# Patient Record
Sex: Male | Born: 1935 | Race: White | Hispanic: No | Smoking: Former smoker
Health system: Southern US, Community
[De-identification: ages and names within clinical notes are randomized; demographics above are authoritative.]

## PROBLEM LIST (undated history)

## (undated) DIAGNOSIS — N419 Inflammatory disease of prostate, unspecified: Secondary | ICD-10-CM

## (undated) DIAGNOSIS — E119 Type 2 diabetes mellitus without complications: Secondary | ICD-10-CM

## (undated) DIAGNOSIS — E785 Hyperlipidemia, unspecified: Secondary | ICD-10-CM

## (undated) DIAGNOSIS — F0390 Unspecified dementia without behavioral disturbance: Secondary | ICD-10-CM

## (undated) DIAGNOSIS — M1990 Unspecified osteoarthritis, unspecified site: Secondary | ICD-10-CM

## (undated) DIAGNOSIS — H269 Unspecified cataract: Secondary | ICD-10-CM

## (undated) DIAGNOSIS — Z87442 Personal history of urinary calculi: Secondary | ICD-10-CM

## (undated) DIAGNOSIS — H9190 Unspecified hearing loss, unspecified ear: Secondary | ICD-10-CM

## (undated) DIAGNOSIS — C801 Malignant (primary) neoplasm, unspecified: Secondary | ICD-10-CM

## (undated) DIAGNOSIS — R339 Retention of urine, unspecified: Secondary | ICD-10-CM

## (undated) DIAGNOSIS — N400 Enlarged prostate without lower urinary tract symptoms: Secondary | ICD-10-CM

## (undated) DIAGNOSIS — F419 Anxiety disorder, unspecified: Secondary | ICD-10-CM

## (undated) DIAGNOSIS — I10 Essential (primary) hypertension: Secondary | ICD-10-CM

## (undated) DIAGNOSIS — N390 Urinary tract infection, site not specified: Secondary | ICD-10-CM

## (undated) DIAGNOSIS — R972 Elevated prostate specific antigen [PSA]: Secondary | ICD-10-CM

## (undated) HISTORY — DX: Essential (primary) hypertension: I10

## (undated) HISTORY — PX: CATARACT EXTRACTION: SUR2

## (undated) HISTORY — DX: Personal history of urinary calculi: Z87.442

## (undated) HISTORY — DX: Unspecified hearing loss, unspecified ear: H91.90

## (undated) HISTORY — DX: Elevated prostate specific antigen (PSA): R97.20

## (undated) HISTORY — DX: Urinary tract infection, site not specified: N39.0

## (undated) HISTORY — PX: HERNIA REPAIR: SHX51

## (undated) HISTORY — DX: Retention of urine, unspecified: R33.9

## (undated) HISTORY — DX: Unspecified osteoarthritis, unspecified site: M19.90

## (undated) HISTORY — PX: BACK SURGERY: SHX140

## (undated) HISTORY — DX: Inflammatory disease of prostate, unspecified: N41.9

## (undated) HISTORY — DX: Type 2 diabetes mellitus without complications: E11.9

---

## 2012-12-03 ENCOUNTER — Emergency Department: Payer: Self-pay | Admitting: Internal Medicine

## 2012-12-03 LAB — URINALYSIS, COMPLETE
Ketone: NEGATIVE
Nitrite: NEGATIVE
Ph: 5 (ref 4.5–8.0)
Protein: NEGATIVE
RBC,UR: 1 /HPF (ref 0–5)
Specific Gravity: 1.026 (ref 1.003–1.030)
WBC UR: 3 /HPF (ref 0–5)

## 2013-01-09 ENCOUNTER — Ambulatory Visit (INDEPENDENT_AMBULATORY_CARE_PROVIDER_SITE_OTHER): Payer: Medicare HMO | Admitting: Podiatry

## 2013-01-09 ENCOUNTER — Encounter: Payer: Self-pay | Admitting: Podiatry

## 2013-01-09 ENCOUNTER — Ambulatory Visit (INDEPENDENT_AMBULATORY_CARE_PROVIDER_SITE_OTHER): Payer: Medicare HMO

## 2013-01-09 VITALS — BP 122/84 | HR 68 | Resp 16 | Ht 75.0 in | Wt 213.0 lb

## 2013-01-09 DIAGNOSIS — R52 Pain, unspecified: Secondary | ICD-10-CM

## 2013-01-09 DIAGNOSIS — L97509 Non-pressure chronic ulcer of other part of unspecified foot with unspecified severity: Secondary | ICD-10-CM

## 2013-01-09 DIAGNOSIS — M2040 Other hammer toe(s) (acquired), unspecified foot: Secondary | ICD-10-CM

## 2013-01-09 NOTE — Progress Notes (Signed)
Subjective:     Patient ID: Albert Fritz, male   DOB: [birth year], 79 y.o.   MRN: [id number]  HPI patient states she was concerned about his second toe left and he needs diabetic shoes due to his advanced neurological and vascular disease along with diabetes   Review of Systems  All other systems reviewed and are negative.       Objective:   Physical Exam  Skin: Skin is dry.   neurovascular status diminished both feet with digital deformity and distal keratotic lesion second digit left that is painful with no current breakdown of skin     Assessment:     Mild distal keratotic lesion secondary to foot structure and add wrist diabetic environment    Plan:     Debrided lesion with no drainage and applied dressing. Advised on diabetic shoes which we will get permission from family physician for

## 2013-01-09 NOTE — Patient Instructions (Signed)
Continue soaks and call us if it gets red or stays painful

## 2013-01-19 ENCOUNTER — Telehealth: Payer: Self-pay | Admitting: Podiatry

## 2013-01-19 NOTE — Telephone Encounter (Signed)
Left a message with his friend for him to schedule an appointment to be measured for diabetic shoes

## 2013-01-30 ENCOUNTER — Ambulatory Visit (INDEPENDENT_AMBULATORY_CARE_PROVIDER_SITE_OTHER): Payer: Medicare HMO | Admitting: Podiatry

## 2013-01-30 DIAGNOSIS — B351 Tinea unguium: Secondary | ICD-10-CM

## 2013-01-30 DIAGNOSIS — M2040 Other hammer toe(s) (acquired), unspecified foot: Secondary | ICD-10-CM

## 2013-01-31 NOTE — Progress Notes (Signed)
Subjective:     Patient ID: Albert Fritz, male   DOB: 06/19/1928, 79 y.o.   MRN: [id number]  HPI here to be measured for diabetic shoes. Patient is at risk   Review of Systems     Objective:   Physical Exam Diminished neurovascular status with callus corn formation pre-alterative in nature and hammertoe    Assessment:     At wrist diabetic with chances for future problems with this the    Plan:     Measured for diabetic shoes at the current time

## 2013-03-02 ENCOUNTER — Ambulatory Visit (INDEPENDENT_AMBULATORY_CARE_PROVIDER_SITE_OTHER): Payer: Medicare HMO | Admitting: Podiatry

## 2013-03-02 ENCOUNTER — Encounter: Payer: Self-pay | Admitting: Podiatry

## 2013-03-02 VITALS — BP 104/64 | HR 95 | Resp 16 | Ht 75.0 in | Wt 200.0 lb

## 2013-03-02 DIAGNOSIS — E1159 Type 2 diabetes mellitus with other circulatory complications: Secondary | ICD-10-CM

## 2013-03-02 DIAGNOSIS — M2040 Other hammer toe(s) (acquired), unspecified foot: Secondary | ICD-10-CM

## 2013-03-02 DIAGNOSIS — L84 Corns and callosities: Secondary | ICD-10-CM

## 2013-03-02 NOTE — Progress Notes (Signed)
Subjective:     Patient ID: Albert Fritz, male   DOB: 10-08-1928, 79 y.o.   MRN: [id number]  HPI patient presents to pick up diabetic shoes with previous radial type lesions and significant circulatory and neurological issues long-term with diabetes   Review of Systems     Objective:   Physical Exam Neurovascular status unchanged with structural alignment noted   to be unhealthy Assessment:     At risk diabetic with chronic foot structural issues and neurological vascular disease    Plan:     Dispensed diabetic shoes with all instructions at the current time. Reappoint for routine care

## 2013-09-07 ENCOUNTER — Emergency Department: Payer: Self-pay | Admitting: Emergency Medicine

## 2013-09-07 LAB — URINALYSIS, COMPLETE
Bacteria: NEGATIVE
Bilirubin,UR: NEGATIVE
Blood: NEGATIVE
Glucose,UR: NEGATIVE mg/dL (ref 0–75)
Ketone: NEGATIVE
LEUKOCYTE ESTERASE: NEGATIVE
PROTEIN: NEGATIVE
Ph: 6 (ref 4.5–8.0)
RBC,UR: NONE SEEN /HPF (ref 0–5)
SPECIFIC GRAVITY: 1.009 (ref 1.003–1.030)

## 2013-11-02 ENCOUNTER — Emergency Department: Payer: Self-pay | Admitting: Internal Medicine

## 2013-11-02 LAB — URINALYSIS, COMPLETE
Bilirubin,UR: NEGATIVE
Glucose,UR: NEGATIVE mg/dL (ref 0–75)
Ketone: NEGATIVE
Nitrite: NEGATIVE
Ph: 7 (ref 4.5–8.0)
Protein: NEGATIVE
RBC,UR: 13 /HPF (ref 0–5)
SQUAMOUS EPITHELIAL: NONE SEEN
Specific Gravity: 1.008 (ref 1.003–1.030)

## 2013-11-09 ENCOUNTER — Emergency Department: Payer: Self-pay | Admitting: Emergency Medicine

## 2013-12-08 ENCOUNTER — Emergency Department: Payer: Self-pay | Admitting: Emergency Medicine

## 2014-01-09 ENCOUNTER — Emergency Department: Payer: Self-pay | Admitting: Emergency Medicine

## 2014-07-07 ENCOUNTER — Emergency Department: Payer: Medicare HMO

## 2014-07-07 ENCOUNTER — Encounter: Payer: Self-pay | Admitting: Emergency Medicine

## 2014-07-07 ENCOUNTER — Emergency Department
Admission: EM | Admit: 2014-07-07 | Discharge: 2014-07-07 | Disposition: A | Discharge status: Home / Self Care | Payer: Medicare HMO | Attending: Emergency Medicine | Admitting: Emergency Medicine

## 2014-07-07 DIAGNOSIS — I10 Essential (primary) hypertension: Secondary | ICD-10-CM | POA: Insufficient documentation

## 2014-07-07 DIAGNOSIS — Z87891 Personal history of nicotine dependence: Secondary | ICD-10-CM | POA: Insufficient documentation

## 2014-07-07 DIAGNOSIS — R1030 Lower abdominal pain, unspecified: Secondary | ICD-10-CM | POA: Insufficient documentation

## 2014-07-07 DIAGNOSIS — Z79899 Other long term (current) drug therapy: Secondary | ICD-10-CM | POA: Insufficient documentation

## 2014-07-07 DIAGNOSIS — T83511A Infection and inflammatory reaction due to indwelling urethral catheter, initial encounter: Secondary | ICD-10-CM

## 2014-07-07 DIAGNOSIS — I4892 Unspecified atrial flutter: Secondary | ICD-10-CM | POA: Insufficient documentation

## 2014-07-07 DIAGNOSIS — E119 Type 2 diabetes mellitus without complications: Secondary | ICD-10-CM | POA: Insufficient documentation

## 2014-07-07 DIAGNOSIS — N390 Urinary tract infection, site not specified: Secondary | ICD-10-CM | POA: Diagnosis not present

## 2014-07-07 DIAGNOSIS — T8351XA Infection and inflammatory reaction due to indwelling urinary catheter, initial encounter: Secondary | ICD-10-CM | POA: Insufficient documentation

## 2014-07-07 DIAGNOSIS — Y846 Urinary catheterization as the cause of abnormal reaction of the patient, or of later complication, without mention of misadventure at the time of the procedure: Secondary | ICD-10-CM | POA: Diagnosis not present

## 2014-07-07 DIAGNOSIS — R531 Weakness: Secondary | ICD-10-CM | POA: Diagnosis present

## 2014-07-07 LAB — URINALYSIS COMPLETE WITH MICROSCOPIC (ARMC ONLY)
BILIRUBIN URINE: NEGATIVE
GLUCOSE, UA: NEGATIVE mg/dL
KETONES UR: NEGATIVE mg/dL
Nitrite: NEGATIVE
Protein, ur: NEGATIVE mg/dL
SQUAMOUS EPITHELIAL / LPF: NONE SEEN
Specific Gravity, Urine: 1.008 (ref 1.005–1.030)
pH: 6 (ref 5.0–8.0)

## 2014-07-07 LAB — CBC WITH DIFFERENTIAL/PLATELET
Basophils Absolute: 0 10*3/uL (ref 0–0.1)
Basophils Relative: 0 %
Eosinophils Absolute: 0.1 10*3/uL (ref 0–0.7)
Eosinophils Relative: 1 %
HCT: 44.9 % (ref 40.0–52.0)
Hemoglobin: 14.5 g/dL (ref 13.0–18.0)
LYMPHS ABS: 2.8 10*3/uL (ref 1.0–3.6)
Lymphocytes Relative: 31 %
MCH: 29.5 pg (ref 26.0–34.0)
MCHC: 32.4 g/dL (ref 32.0–36.0)
MCV: 91 fL (ref 80.0–100.0)
Monocytes Absolute: 0.9 10*3/uL (ref 0.2–1.0)
Monocytes Relative: 10 %
NEUTROS ABS: 5.2 10*3/uL (ref 1.4–6.5)
Platelets: 197 10*3/uL (ref 150–440)
RBC: 4.93 MIL/uL (ref 4.40–5.90)
RDW: 13.8 % (ref 11.5–14.5)
WBC: 8.9 10*3/uL (ref 3.8–10.6)

## 2014-07-07 LAB — COMPREHENSIVE METABOLIC PANEL
ALBUMIN: 3.8 g/dL (ref 3.5–5.0)
ALK PHOS: 132 U/L — AB (ref 38–126)
ALT: 25 U/L (ref 17–63)
ANION GAP: 9 (ref 5–15)
AST: 33 U/L (ref 15–41)
BUN: 17 mg/dL (ref 6–20)
CHLORIDE: 100 mmol/L — AB (ref 101–111)
CO2: 30 mmol/L (ref 22–32)
Calcium: 9.2 mg/dL (ref 8.9–10.3)
Creatinine, Ser: 0.98 mg/dL (ref 0.61–1.24)
GFR calc Af Amer: >60 mL/min (ref >60)
GFR calc non Af Amer: >60 mL/min (ref >60)
Glucose, Bld: 125 mg/dL — ABNORMAL HIGH (ref 65–99)
Potassium: 5.2 mmol/L — ABNORMAL HIGH (ref 3.5–5.1)
Sodium: 139 mmol/L (ref 135–145)
Total Bilirubin: 0.7 mg/dL (ref 0.3–1.2)
Total Protein: 7.2 g/dL (ref 6.5–8.1)

## 2014-07-07 LAB — TROPONIN I: Troponin I: <0.03 ng/mL (ref <0.031)

## 2014-07-07 LAB — LIPASE, BLOOD: Lipase: 28 U/L (ref 22–51)

## 2014-07-07 MED ORDER — MORPHINE SULFATE 4 MG/ML IJ SOLN
4.0000 mg | Freq: Once | INTRAMUSCULAR | Status: AC
  Administered 2014-07-07: 4 mg via INTRAVENOUS

## 2014-07-07 MED ORDER — CIPROFLOXACIN HCL 500 MG PO TABS: 500.0000 mg | ORAL_TABLET | Freq: Two times a day (BID) | ORAL | Status: AC

## 2014-07-07 MED ORDER — IOHEXOL 300 MG/ML  SOLN
100.0000 mL | Freq: Once | INTRAMUSCULAR | Status: AC | PRN
  Administered 2014-07-07: 100 mL via INTRAVENOUS

## 2014-07-07 MED ORDER — ONDANSETRON HCL 4 MG PO TABS: 4.0000 mg | ORAL_TABLET | Freq: Every day | ORAL | Status: DC | PRN

## 2014-07-07 MED ORDER — SODIUM CHLORIDE 0.9 % IV SOLN
1000.0000 mL | Freq: Once | INTRAVENOUS | Status: AC
  Administered 2014-07-07: 1000 mL via INTRAVENOUS

## 2014-07-07 MED ORDER — ONDANSETRON HCL 4 MG/2ML IJ SOLN
INTRAMUSCULAR | Status: AC
  Administered 2014-07-07: 4 mg via INTRAVENOUS
  Filled 2014-07-07: qty 2

## 2014-07-07 MED ORDER — DEXTROSE 5 % IV SOLN
INTRAVENOUS | Status: AC
  Administered 2014-07-07: 1 g via INTRAVENOUS
  Filled 2014-07-07: qty 10

## 2014-07-07 MED ORDER — ONDANSETRON HCL 4 MG/2ML IJ SOLN
4.0000 mg | Freq: Once | INTRAMUSCULAR | Status: AC
  Administered 2014-07-07: 4 mg via INTRAVENOUS

## 2014-07-07 MED ORDER — MORPHINE SULFATE 4 MG/ML IJ SOLN
INTRAMUSCULAR | Status: AC
  Administered 2014-07-07: 4 mg via INTRAVENOUS
  Filled 2014-07-07: qty 1

## 2014-07-07 MED ORDER — DEXTROSE 5 % IV SOLN: 1.0000 g | Freq: Once | INTRAVENOUS | Status: AC

## 2014-07-07 NOTE — ED Notes (Signed)
Nurse called to room for severe lower abd pain. Jacqualine Code, MD also brought to room. Pt lower abd distended. New orders received. IN and out th done. Pt states relief after draining bladder. Pt with 800cc urine output

## 2014-07-07 NOTE — ED Notes (Signed)
Patient feels dizzy, nervousness and weakness. Also c/o abdominal pain. Lower abdominal pain

## 2014-07-07 NOTE — ED Notes (Addendum)
Nurse called to room . Pt back from CT scan. Pt states it is time for him to in and out cath. Pt with 751ml urine out put

## 2014-07-07 NOTE — ED Provider Notes (Signed)
[facility] Emergency Department Provider Note  ____________________________________________  Time seen: 8:25 AM  I have reviewed the triage vital signs and the nursing notes.   HISTORY  Chief Complaint Weakness and Abdominal Pain   Patient reports feeling generally weak for about one weeks time. Patient does report a feeling of aching in his lower abdomen across his bladder. He reports that he is eating slightly less, but has not had any fevers, chest pain, shortness of breath, or severe pain.  Patient does note that he has been self catheterizing for approximately one year due to prostate difficulties.  Rate his pain as mild, location is over the bladder, is associated with some burning with urination recently.  Pain is worse with urination.  In addition, when questioning patient about history of cardiac disease he states he has none. His ECG showed atrial flutter and he states he has no history of this. He denies any chest pain, no shortness of breath, no exertional dyspnea. Patient reports no palpitations. He does take a full strength aspirin daily.    HPI Albert Fritz is a 81 y.o. male here with lowrr abd pain and burning with urination.   Past Medical History  Diagnosis Date  . Diabetes mellitus without complication   . Hypertension     There are no active problems to display for this patient.   Past Surgical History  Procedure Laterality Date  . Back surgery    . Hernia repair      Current Outpatient Rx  Name  Route  Sig  Dispense  Refill  . atorvastatin (LIPITOR) 10 MG tablet   Oral   Take 10 mg by mouth daily.         Marland Kitchen doxazosin (CARDURA) 2 MG tablet   Oral   Take 2 mg by mouth daily.         Marland Kitchen glipiZIDE (GLUCOTROL) 5 MG tablet   Oral   Take 5 mg by mouth daily before breakfast.         . hydrochlorothiazide (MICROZIDE) 12.5 MG capsule   Oral   Take 12.5 mg by mouth daily.         . metFORMIN (GLUMETZA) 500 MG  (MOD) 24 hr tablet   Oral   Take 500 mg by mouth daily with breakfast.         . quinapril (ACCUPRIL) 20 MG tablet   Oral   Take 20 mg by mouth at bedtime.           Allergies Review of patient's allergies indicates no known allergies.  History reviewed. No pertinent family history.  Social History History  Substance Use Topics  . Smoking status: Former Smoker    Types: Cigarettes  . Smokeless tobacco: Never Used     Comment: quit   . Alcohol Use: No    Review of Systems  Constitutional: Negative for fever. Eyes: Negative for visual changes. ENT: Negative for sore throat. Cardiovascular: Negative for chest pain. Respiratory: Negative for shortness of breath. Gastrointestinal: Negative vomiting and diarrhea. Patient reports some burning with urination, an achy pain over the lower abdomen. Genitourinary: Negative for dysuria. Musculoskeletal: Negative for back pain. Skin: Negative for rash. Neurological: Negative for headaches, focal weakness or numbness.   10-point ROS otherwise negative.  ____________________________________________   PHYSICAL EXAM:  VITAL SIGNS: ED Triage Vitals  Enc Vitals Group     BP 07/07/14 0741 104/55 mmHg     Pulse Rate 07/07/14 0741 90  Resp 07/07/14 0741 18     Temp 07/07/14 0741 97.8 F (36.6 C)     Temp Source 07/07/14 0741 Oral     SpO2 07/07/14 0741 97 %     Weight 07/07/14 0741 195 lb (88.451 kg)     Height 07/07/14 0741 6\' 3"  (1.905 m)     Head Cir --      Peak Flow --      Pain Score 07/07/14 0815 2     Pain Loc --      Pain Edu? --      Excl. in [place]? --      Constitutional: Alert and oriented. Well appearing and in no distress. Eyes: Conjunctivae are normal. PERRL. Normal extraocular movements. ENT   Head: Normocephalic and atraumatic.   Nose: No congestion/rhinnorhea.   Mouth/Throat: Mucous membranes are moist.   Neck: No stridor. Hematological/Lymphatic/Immunilogical: No cervical  lymphadenopathy. Cardiovascular: Normal rate, irregular rhythm. Normal and symmetric distal pulses are present in all extremities. No murmurs, rubs, or gallops. Respiratory: Normal respiratory effort without tachypnea nor retractions. Breath sounds are clear and equal bilaterally. No wheezes/rales/rhonchi. Gastrointestinal: Soft and nontender except for mild tenderness in the suprapubic area. There is no right lower quadrant pain there is no rebound or guarding.. No distention. No abdominal bruits. There is no CVA tenderness. Genitourinary: Normal circumcised penis with no evidence of redness. Testicles are nontender descended bilaterally. There is no groin mass or hernia present. Musculoskeletal: Nontender with normal range of motion in all extremities. No joint effusions.   Right lower leg:  No tenderness or edema.   Left lower leg:  No tenderness or edema. Neurologic:  Normal speech and language. No gross focal neurologic deficits are appreciated. Speech is normal. No gait instability. Skin:  Skin is warm, dry and intact. No rash noted. Psychiatric: Mood and affect are normal. Speech and behavior are normal. Patient exhibits appropriate insight and judgment.  ____________________________________________   EKG  EKG reveals atrial flutter with variable block, ventricular rate 91, left axis deviation is noted, potential evidence for old inferior infarct is noted. There is no acute ST elevation noted. I do not see any acute ischemic changes present on today's 12-lead. Ventricular rate 91 QRS 84 QTc 477.  ____________________________________________    RADIOLOGY  IMPRESSION: 1. Irregular soft tissue mass within the base the bladder. Considerations include enlarged prostate or primary bladder malignancy. Consider cystoscopy for further evaluation. 2. Distended urinary bladder. 3. Prostatic enlargement. 4. Cardiomegaly. 5. Coronary artery disease. 6. No renal mass or ureteral  stones. 7. Normal appendix. 8. Small fat containing paraumbilical hernia.  ____________________________________________   PROCEDURES  Procedure(s) performed: None  Critical Care performed: No  ____________________________________________   INITIAL IMPRESSION / ASSESSMENT AND PLAN / ED COURSE  Pertinent labs & imaging results that were available during my care of the patient were reviewed by me and considered in my medical decision making (see chart for details).  .Time stamp  Overall patient appears well, his symptoms are most suggestive of a possible UTI. However, we'll obtain basic labs, urinalysis, troponin given new onset atrial flutter of indeterminant chronicity. Patient denies any cardiopulmonary symptoms at this time and I wonder if this is a more chronic process, however nothing in patient's previous chart supports a history of prior atrial flutter.  I plan to review labs, provide hydration the patient, and discussed with cardiology regarding his atrial flutter. At present I do not think there is indication for admission based on his atrial flutter  and discussion with patient and his family would like to defer to discussion with cardiology or with  primary regarding anticoagulation. We did discuss stroke risk being elevated with this, and patient understands this and importance of follow-up regarding this new atrial flutter.  11:10 PM, patient reports sudden worsening of lower abdominal pain which she states is severe and over the suprapubic region. Does appear that there is some swelling suprapubically, this could be related to urinary retention. We will attempt to catheterize patient. Given age and worsening of pain I have now ordered a CT scan.  Of note labs are delayed at present due to new medical record and lab related issues.    Will medicate patient with morphine and Zofran at this time.  Results for orders placed or performed during the hospital encounter of  07/07/14  Comprehensive metabolic panel  Result Value Ref Range   Sodium 139 135 - 145 mmol/L   Potassium 5.2 (H) 3.5 - 5.1 mmol/L   Chloride 100 (L) 101 - 111 mmol/L   CO2 30 22 - 32 mmol/L   Glucose, Bld 125 (H) 65 - 99 mg/dL   BUN 17 6 - 20 mg/dL   Creatinine, Ser 0.98 0.61 - 1.24 mg/dL   Calcium 9.2 8.9 - 10.3 mg/dL   Total Protein 7.2 6.5 - 8.1 g/dL   Albumin 3.8 3.5 - 5.0 g/dL   AST 33 15 - 41 U/L   ALT 25 17 - 63 U/L   Alkaline Phosphatase 132 (H) 38 - 126 U/L   Total Bilirubin 0.7 0.3 - 1.2 mg/dL   GFR calc non Af Amer >60 >60 mL/min   GFR calc Af Amer >60 >60 mL/min   Anion gap 9 5 - 15  Lipase, blood  Result Value Ref Range   Lipase 28 22 - 51 U/L  CBC WITH DIFFERENTIAL  Result Value Ref Range   WBC 8.9 3.8 - 10.6 K/uL   RBC 4.93 4.40 - 5.90 MIL/uL   Hemoglobin 14.5 13.0 - 18.0 g/dL   HCT 44.9 40.0 - 52.0 %   MCV 91.0 80.0 - 100.0 fL   MCH 29.5 26.0 - 34.0 pg   MCHC 32.4 32.0 - 36.0 g/dL   RDW 13.8 11.5 - 14.5 %   Platelets 197 150 - 440 K/uL   Neutrophils Relative % 58% %   Neutro Abs 5.2 1.4 - 6.5 K/uL   Lymphocytes Relative 31% %   Lymphs Abs 2.8 1.0 - 3.6 K/uL   Monocytes Relative 10% %   Monocytes Absolute 0.9 0.2 - 1.0 K/uL   Eosinophils Relative 1% %   Eosinophils Absolute 0.1 0 - 0.7 K/uL   Basophils Relative 0% %   Basophils Absolute 0.0 0 - 0.1 K/uL  Urinalysis complete, with microscopic [facility])  Result Value Ref Range   Color, Urine YELLOW (A) YELLOW   APPearance CLEAR (A) CLEAR   Glucose, UA NEGATIVE NEGATIVE mg/dL   Bilirubin Urine NEGATIVE NEGATIVE   Ketones, ur NEGATIVE NEGATIVE mg/dL   Specific Gravity, Urine 1.008 1.005 - 1.030   Hgb urine dipstick 1+ (A) NEGATIVE   pH 6.0 5.0 - 8.0   Protein, ur NEGATIVE NEGATIVE mg/dL   Nitrite NEGATIVE NEGATIVE   Leukocytes, UA 2+ (A) NEGATIVE   RBC / HPF 0-5 0 - 5 RBC/hpf   WBC, UA 6-30 0 - 5 WBC/hpf   Bacteria, UA RARE (A) NONE SEEN   Squamous Epithelial / LPF NONE SEEN NONE SEEN   Patient  labs and history are consistent with urinary tract infection. Treated with ceftriaxone here, and continued will continue patient on Cipro for the next week. Patient has a urologist and primary care physician whom he can follow up with, I discussed the CT findings with him and he is quite agreeable to plan of care. In addition I spoke to Dr. Josefa Half of cardiology regarding patient returns asymptomatic atrial flutter, patient will follow-up with him in the clinic next week.  Coverage and cautioned including worsening symptoms, weakness, inability urinate or self catheter, fever, palpitations, chest pain discussed with patient and he is quite amenable to plan of care and follow-up as an outpatient. ____________________________________________   FINAL CLINICAL IMPRESSION(S) / ED DIAGNOSES  Final diagnoses:  Atrial flutter, unspecified  Urinary tract infection associated with catheterization of urinary tract, initial encounter     Delman Kitten, MD 07/07/14 1627

## 2014-07-07 NOTE — Discharge Instructions (Signed)
Atrial Flutter Atrial flutter is a heart rhythm that can cause the heart to beat very fast (tachycardia). It originates in the upper chambers of the heart (atria). In atrial flutter, the top chambers of the heart (atria) often beat much faster than the bottom chambers of the heart (ventricles). Atrial flutter has a regular "saw toothed" appearance in an EKG readout. An EKG is a test that records the electrical activity of the heart. Atrial flutter can cause the heart to beat up to 150 beats per minute (BPM). Atrial flutter can either be short lived (paroxysmal) or permanent.  CAUSES  Causes of atrial flutter can be many. Some of these include:  Heart related issues:  Heart attack (myocardial infarction).  Heart failure.  Heart valve problems.  Poorly controlled high blood pressure (hypertension).  After open heart surgery.  Lung related issues:  A blood clot in the lungs (pulmonary embolism).  Chronic obstructive pulmonary disease (COPD). Medications used to treat COPD can attribute to atrial flutter.  Other related causes:  Hyperthyroidism.  Caffeine.  Some decongestant cold medications.  Low electrolyte levels such as potassium or magnesium.  Cocaine. SYMPTOMS  An awareness of your heart beating rapidly (palpitations).  Shortness of breath.  Chest pain.  Low blood pressure (hypotension).  Dizziness or fainting. DIAGNOSIS  Different tests can be performed to diagnose atrial flutter.   An EKG.  Holter monitor. This is a 24-hour recording of your heart rhythm. You will also be given a diary. Write down all symptoms that you have and what you were doing at the time you experienced symptoms.  Cardiac event monitor. This small device can be worn for up to 30 days. When you have heart symptoms, you will push a button on the device. This will then record your heart rhythm.  Echocardiogram. This is an imaging test to look at your heart. Your caregiver will look at your  heart valves and the ventricles.  Stress test. This test can help determine if the atrial flutter is related to exercise or if coronary artery disease is present.  Laboratory studies will look at certain blood levels like:  Complete blood count (CBC).  Potassium.  Magnesium.  Thyroid function. TREATMENT  Treatment of atrial flutter varies. A combination of therapies may be used or sometimes atrial flutter may need only 1 type of treatment.  Lab work: If your blood work, such as your electrolytes (potassium, magnesium) or your thyroid function tests, are abnormal, your caregiver will treat them accordingly.  Medication:  There are several different types of medications that can convert your heart to a normal rhythm and prevent atrial flutter from reoccurring.  Nonsurgical procedures: Nonsurgical techniques may be used to control atrial flutter. Some examples include:  Cardioversion. This technique uses either drugs or an electrical shock to restore a normal heart rhythm:  Cardioversion drugs may be given through an intravenous (IV) line to help "reset" the heart rhythm.  In electrical cardioversion, your caregiver shocks your heart with electrical energy. This helps to reset the heartbeat to a normal rhythm.  Ablation. If atrial flutter is a persistent problem, an ablation may be needed. This procedure is done under mild sedation. High frequency radio-wave energy is used to destroy the area of heart tissue responsible for atrial flutter. SEEK IMMEDIATE MEDICAL CARE IF:  You have:  Dizziness.  Near fainting or fainting.  Shortness of breath.  Chest pain or pressure.  Sudden nausea or vomiting.  Profuse sweating. If you have the above symptoms,  call your local emergency service immediately! Do not drive yourself to the hospital. MAKE SURE YOU:   Understand these instructions.  Will watch your condition.  Will get help right away if you are not doing well or get  worse. Document Released: 07/11/2008 Document Revised: 07/09/2013 Document Reviewed: 07/11/2008 [facility] Patient Information 2015 [place], [place]. This information is not intended to replace advice given to you by your health care provider. Make sure you discuss any questions you have with your health care provider.  Urinary Tract Infection Urinary tract infections (UTIs) can develop anywhere along your urinary tract. Your urinary tract is your body's drainage system for removing wastes and extra water. Your urinary tract includes two kidneys, two ureters, a bladder, and a urethra. Your kidneys are a pair of bean-shaped organs. Each kidney is about the size of your fist. They are located below your ribs, one on each side of your spine. CAUSES Infections are caused by microbes, which are microscopic organisms, including fungi, viruses, and bacteria. These organisms are so small that they can only be seen through a microscope. Bacteria are the microbes that most commonly cause UTIs. SYMPTOMS  Symptoms of UTIs may vary by age and gender of the patient and by the location of the infection. Symptoms in young women typically include a frequent and intense urge to urinate and a painful, burning feeling in the bladder or urethra during urination. Older women and men are more likely to be tired, shaky, and weak and have muscle aches and abdominal pain. A fever may mean the infection is in your kidneys. Other symptoms of a kidney infection include pain in your back or sides below the ribs, nausea, and vomiting. DIAGNOSIS To diagnose a UTI, your caregiver will ask you about your symptoms. Your caregiver also will ask to provide a urine sample. The urine sample will be tested for bacteria and white blood cells. White blood cells are made by your body to help fight infection. TREATMENT  Typically, UTIs can be treated with medication. Because most UTIs are caused by a bacterial infection, they usually can be treated  with the use of antibiotics. The choice of antibiotic and length of treatment depend on your symptoms and the type of bacteria causing your infection. HOME CARE INSTRUCTIONS  If you were prescribed antibiotics, take them exactly as your caregiver instructs you. Finish the medication even if you feel better after you have only taken some of the medication.  Drink enough water and fluids to keep your urine clear or pale yellow.  Avoid caffeine, tea, and carbonated beverages. They tend to irritate your bladder.  Empty your bladder often. Avoid holding urine for long periods of time.  Empty your bladder before and after sexual intercourse.  After a bowel movement, women should cleanse from front to back. Use each tissue only once. SEEK MEDICAL CARE IF:   You have back pain.  You develop a fever.  Your symptoms do not begin to resolve within 3 days. SEEK IMMEDIATE MEDICAL CARE IF:   You have severe back pain or lower abdominal pain.  You develop chills.  You have nausea or vomiting.  You have continued burning or discomfort with urination. MAKE SURE YOU:   Understand these instructions.  Will watch your condition.  Will get help right away if you are not doing well or get worse. Document Released: 12/02/2004 Document Revised: 08/24/2011 Document Reviewed: 04/02/2011 [facility] Patient Information 2015 [place], [place]. This information is not intended to replace advice given  to you by your health care provider. Make sure you discuss any questions you have with your health care provider.

## 2014-07-11 DIAGNOSIS — I4892 Unspecified atrial flutter: Secondary | ICD-10-CM | POA: Insufficient documentation

## 2014-07-11 DIAGNOSIS — I10 Essential (primary) hypertension: Secondary | ICD-10-CM | POA: Insufficient documentation

## 2014-09-05 ENCOUNTER — Ambulatory Visit
Admission: RE | Admit: 2014-09-05 | Discharge: 2014-09-05 | Disposition: A | Discharge status: Home / Self Care | Payer: Medicare HMO | Source: Ambulatory Visit | Attending: Family Medicine | Admitting: Family Medicine

## 2014-09-05 ENCOUNTER — Other Ambulatory Visit: Payer: Self-pay | Admitting: Family Medicine

## 2014-09-05 DIAGNOSIS — R5383 Other fatigue: Secondary | ICD-10-CM

## 2014-09-05 DIAGNOSIS — M5136 Other intervertebral disc degeneration, lumbar region: Secondary | ICD-10-CM | POA: Insufficient documentation

## 2014-09-05 DIAGNOSIS — R972 Elevated prostate specific antigen [PSA]: Secondary | ICD-10-CM | POA: Insufficient documentation

## 2014-09-05 DIAGNOSIS — R0781 Pleurodynia: Secondary | ICD-10-CM | POA: Insufficient documentation

## 2014-09-05 DIAGNOSIS — R5381 Other malaise: Secondary | ICD-10-CM | POA: Diagnosis present

## 2014-09-11 ENCOUNTER — Ambulatory Visit
Admission: RE | Admit: 2014-09-11 | Discharge: 2014-09-11 | Disposition: A | Discharge status: Home / Self Care | Payer: Medicare HMO | Source: Ambulatory Visit | Attending: Radiation Oncology | Admitting: Radiation Oncology

## 2014-09-11 ENCOUNTER — Encounter: Payer: Self-pay | Admitting: Radiation Oncology

## 2014-09-11 VITALS — Wt 199.0 lb

## 2014-09-11 DIAGNOSIS — R972 Elevated prostate specific antigen [PSA]: Secondary | ICD-10-CM

## 2014-09-11 HISTORY — DX: Unspecified osteoarthritis, unspecified site: M19.90

## 2014-09-11 HISTORY — DX: Anxiety disorder, unspecified: F41.9

## 2014-09-11 HISTORY — DX: Unspecified cataract: H26.9

## 2014-09-11 HISTORY — DX: Hyperlipidemia, unspecified: E78.5

## 2014-09-11 HISTORY — DX: Benign prostatic hyperplasia without lower urinary tract symptoms: N40.0

## 2014-09-11 NOTE — Consult Note (Signed)
Except an outstanding is perfect of Radiation Oncology NEW PATIENT EVALUATION  Name: Albert Fritz  MRN: [id number]  Date:   09/11/2014     DOB: 27-Jun-1928   This 81 y.o. male patient presents to the clinic for initial evaluation of prostate cancer as yet not staged.  REFERRING PHYSICIAN: Marguerita Merles, MD  CHIEF COMPLAINT:  Chief Complaint  Patient presents with  . Elevated PSA    DIAGNOSIS: The encounter diagnosis was Elevated PSA.   PREVIOUS INVESTIGATIONS:  Clinical notes reviewed CT scan reviewed Prostate biopsy requested  HPI: Patient is an 81 year old male wheelchair-bound with multiple comorbidities including malaise and fatigue adult-onset diabetes hypertension and hyperlipidemia. His PSA has been elevated in the past was 9.2 back in June 2015. His most recent PSA in May 2016 was 91 according to records from the [facility]. He's having no bone pain at this time. He had a CT scan back in May 2016 showing irregular soft tissue mass within the base of the bladder consistent with primary prostate cancer encroaching and possibly invading the bladder. He also has cardiomegaly coronary artery disease. I been asked to evaluate the patient for possible treatment of his resume prostate cancer. Patient has refused biopsy in the past.  PLANNED TREATMENT REGIMEN: Prostate biopsy and possible LHRH agonist therapy  PAST MEDICAL HISTORY:  has a past medical history of Diabetes mellitus without complication; Hypertension; BPH (benign prostatic hyperplasia); Hyperlipidemia; Arthritis; Anxiety; and Cataract.    PAST SURGICAL HISTORY:  Past Surgical History  Procedure Laterality Date  . Back surgery    . Hernia repair    . Back surgery    . Cataract extraction      FAMILY HISTORY: family history includes Cancer in his father.  SOCIAL HISTORY:  reports that he has quit smoking. His smoking use included Cigarettes. He has never used smokeless tobacco. He reports that he  does not drink alcohol or use illicit drugs.  ALLERGIES: Review of patient's allergies indicates no known allergies.  MEDICATIONS:  Current Outpatient Prescriptions  Medication Sig Dispense Refill  . atorvastatin (LIPITOR) 10 MG tablet Take 10 mg by mouth daily.    . finasteride (PROSCAR) 5 MG tablet Take 5 mg by mouth daily.    Marland Kitchen glipiZIDE (GLUCOTROL) 5 MG tablet Take 5 mg by mouth daily before breakfast.    . hydrochlorothiazide (MICROZIDE) 12.5 MG capsule Take 12.5 mg by mouth daily.    Marland Kitchen loratadine (CLARITIN) 10 MG tablet Take 10 mg by mouth daily.    . metFORMIN (GLUMETZA) 500 MG (MOD) 24 hr tablet Take 500 mg by mouth daily with breakfast.    . ondansetron (ZOFRAN) 4 MG tablet Take 1 tablet (4 mg total) by mouth daily as needed for nausea or vomiting. 30 tablet 1  . quinapril (ACCUPRIL) 20 MG tablet Take 20 mg by mouth at bedtime.    . rivaroxaban (XARELTO) 20 MG TABS tablet Take 20 mg by mouth daily with supper.    . tamsulosin (FLOMAX) 0.4 MG CAPS capsule Take 0.4 mg by mouth daily after supper.    . Vitamin D, Ergocalciferol, (DRISDOL) 50000 UNITS CAPS capsule Take 50,000 Units by mouth every 7 (seven) days.    Marland Kitchen doxazosin (CARDURA) 2 MG tablet Take 2 mg by mouth daily.     No current facility-administered medications for this encounter.    ECOG PERFORMANCE STATUS:  0 - Asymptomatic  REVIEW OF SYSTEMS:  Patient denies any weight loss, fatigue, weakness, fever, chills  or night sweats. Patient denies any loss of vision, blurred vision. Patient denies any ringing  of the ears or hearing loss. No irregular heartbeat. Patient denies heart murmur or history of fainting. Patient denies any chest pain or pain radiating to her upper extremities. Patient denies any shortness of breath, difficulty breathing at night, cough or hemoptysis. Patient denies any swelling in the lower legs. Patient denies any nausea vomiting, vomiting of blood, or coffee ground material in the vomitus. Patient  denies any stomach pain. Patient states has had normal bowel movements no significant constipation or diarrhea. Patient denies any dysuria, hematuria or significant nocturia. Patient denies any problems walking, swelling in the joints or loss of balance. Patient denies any skin changes, loss of hair or loss of weight. Patient denies any excessive worrying or anxiety or significant depression. Patient denies any problems with insomnia. Patient denies excessive thirst, polyuria, polydipsia. Patient denies any swollen glands, patient denies easy bruising or easy bleeding. Patient denies any recent infections, allergies or URI. Patient "s visual fields have not changed significantly in recent time.    PHYSICAL EXAM: Wt 199 lb (90.266 kg) Well-developed elderly wheelchair-bound male in NAD. Well-developed well-nourished patient in NAD. HEENT reveals PERLA, EOMI, discs not visualized.  Oral cavity is clear. No oral mucosal lesions are identified. Neck is clear without evidence of cervical or supraclavicular adenopathy. Lungs are clear to A&P. Cardiac examination is essentially unremarkable with regular rate and rhythm without murmur rub or thrill. Abdomen is benign with no organomegaly or masses noted. Motor sensory and DTR levels are equal and symmetric in the upper and lower extremities. Cranial nerves II through XII are grossly intact. Proprioception is intact. No peripheral adenopathy or edema is identified. No motor or sensory levels are noted. Crude visual fields are within normal range.    LABORATORY DATA: PSA trend is reviewed    RADIOLOGY RESULTS: CT scan is reviewed from May 2016   IMPRESSION: Locally advanced probable stage IV prostate cancer in 81 year old male PLAN:  This time I see no evidence to suggest bone metastasis on his CT scan. He does though with the strictly elevated PSA probably have stage IV prostate cancer at this time in an elderly 81 year old male. I discussed this with his son  today I have suggested a transrectal biopsy of his prostate and possible treatment with LHRH agonist in a pulselike fashion. I will also discuss the case with medical oncology for other treatment considerations. We'll also present his case at our weekly tumor conference. Son was in agreement and copperhead in my treatment plan well. He will be talking with urologist about biopsy.   I would like to take this opportunity for allowing me to participate in the care of your patient.Armstead Peaks., MD

## 2014-11-06 ENCOUNTER — Encounter: Payer: Self-pay | Admitting: Urology

## 2014-11-13 ENCOUNTER — Encounter: Payer: Self-pay | Admitting: *Deleted

## 2014-11-20 ENCOUNTER — Other Ambulatory Visit: Payer: Self-pay | Admitting: Urology

## 2014-11-28 ENCOUNTER — Ambulatory Visit: Payer: Self-pay | Admitting: Urology

## 2014-12-02 ENCOUNTER — Ambulatory Visit (INDEPENDENT_AMBULATORY_CARE_PROVIDER_SITE_OTHER): Payer: Medicare HMO | Admitting: Urology

## 2014-12-02 ENCOUNTER — Encounter: Payer: Self-pay | Admitting: Urology

## 2014-12-02 VITALS — BP 109/65 | HR 90 | Ht 75.0 in | Wt 200.0 lb

## 2014-12-02 DIAGNOSIS — N400 Enlarged prostate without lower urinary tract symptoms: Secondary | ICD-10-CM

## 2014-12-02 DIAGNOSIS — R972 Elevated prostate specific antigen [PSA]: Secondary | ICD-10-CM

## 2014-12-02 DIAGNOSIS — N401 Enlarged prostate with lower urinary tract symptoms: Secondary | ICD-10-CM

## 2014-12-02 DIAGNOSIS — R339 Retention of urine, unspecified: Secondary | ICD-10-CM

## 2014-12-02 DIAGNOSIS — R338 Other retention of urine: Secondary | ICD-10-CM

## 2014-12-02 NOTE — Progress Notes (Signed)
12/02/2014 10:37 AM   Albert Fritz [birth year] [id number]  Referring Yukiko Minnich: Marguerita Merles, MD [place], [place] [address]  Chief Complaint  Patient presents with  . Urinary Retention    HPI: Patient is an 81 year old white male with a history of elevated PSA, urinary retention and BPH with LUTS who presents today for a 3 month follow-up.  Elevated PSA   Patient's PSA was 24.6 ng/mL on 05/28/2014 and the elevation was felt to be contributed to his CIC 5-6 times daily.  He presents today for a recheck on his level.  He is complaining of rib and left hip pain.    Urinary retention Patient is now cathing himself up to 7-8 times a day. He states sometimes he does not get a large volume of urine from cathing himself. He himself when he feels urge to void and cannot make urine.    BPH with retention His major complaint today frequent urination, dysuria, nocturia and intermittency.    He has had these symptoms for two years.  He denies any hematuria or suprapubic pain.   He currently taking tamsulosin and finasteride.  He also denies any recent fevers, chills or vomiting, but admits to nausea.  He does not have a family history of PCa.   PMH: Past Medical History  Diagnosis Date  . Diabetes mellitus without complication   . Hypertension   . BPH (benign prostatic hyperplasia)   . Hyperlipidemia   . Arthritis   . Anxiety   . Cataract   . History of kidney stones   . OA (osteoarthritis)   . Prostatitis   . Urinary retention   . Elevated PSA   . UTI (lower urinary tract infection)     Surgical History: Past Surgical History  Procedure Laterality Date  . Back surgery    . Hernia repair    . Back surgery    . Cataract extraction      Home Medications:    Medication List       This list is accurate as of: 12/02/14 11:59 PM.  Always use your most recent med list.               atorvastatin 10 MG tablet  Commonly known as:  LIPITOR  Take 10 mg by  mouth daily.     doxazosin 2 MG tablet  Commonly known as:  CARDURA  Take 2 mg by mouth daily.     finasteride 5 MG tablet  Commonly known as:  PROSCAR  Take 5 mg by mouth daily.     glipiZIDE 5 MG tablet  Commonly known as:  GLUCOTROL  Take 5 mg by mouth daily before breakfast.     hydrochlorothiazide 12.5 MG capsule  Commonly known as:  MICROZIDE  Take 12.5 mg by mouth daily.     metFORMIN 500 MG (MOD) 24 hr tablet  Commonly known as:  GLUMETZA  Take 500 mg by mouth daily with breakfast.     ondansetron 4 MG tablet  Commonly known as:  ZOFRAN  Take 1 tablet (4 mg total) by mouth daily as needed for nausea or vomiting.     quinapril 20 MG tablet  Commonly known as:  ACCUPRIL  Take 20 mg by mouth at bedtime.     tamsulosin 0.4 MG Caps capsule  Commonly known as:  FLOMAX  Take 0.4 mg by mouth daily after supper.     Vitamin D (Ergocalciferol) 50000 UNITS Caps capsule  Commonly known as:  DRISDOL  Take 50,000 Units by mouth every 7 (seven) days.        Allergies: No Known Allergies  Family History: Family History  Problem Relation Age of Onset  . Throat cancer Father     Social History:  reports that he has quit smoking. His smoking use included Cigarettes. He has never used smokeless tobacco. He reports that he does not drink alcohol or use illicit drugs.  ROS: UROLOGY Frequent Urination?: Yes Hard to postpone urination?: No Burning/pain with urination?: Yes Get up at night to urinate?: Yes Leakage of urine?: No Urine stream starts and stops?: Yes Trouble starting stream?: No Do you have to strain to urinate?: No Blood in urine?: No Urinary tract infection?: No Sexually transmitted disease?: No Injury to kidneys or bladder?: No Painful intercourse?: No Weak stream?: No Erection problems?: No Penile pain?: No  Gastrointestinal Nausea?: Yes Vomiting?: No Indigestion/heartburn?: No Diarrhea?: No Constipation?: No  Constitutional Fever:  No Night sweats?: No Weight loss?: No Fatigue?: No  Skin Skin rash/lesions?: No Itching?: No  Eyes Blurred vision?: No Double vision?: No  Ears/Nose/Throat Sore throat?: No Sinus problems?: No  Hematologic/Lymphatic Swollen glands?: No Easy bruising?: Yes  Cardiovascular Leg swelling?: No Chest pain?: No  Respiratory Cough?: No Shortness of breath?: No  Endocrine Excessive thirst?: No  Musculoskeletal Back pain?: Yes Joint pain?: Yes  Neurological Headaches?: No Dizziness?: No  Psychologic Depression?: No Anxiety?: No  Physical Exam: BP 109/65 mmHg  Pulse 90  Ht 6\' 3"  (1.905 m)  Wt 200 lb (90.719 kg)  BMI 25.00 kg/m2   Laboratory Data: Lab Results  Component Value Date   WBC 8.9 07/07/2014   HGB 14.5 07/07/2014   HCT 44.9 07/07/2014   MCV 91.0 07/07/2014   PLT 197 07/07/2014    Lab Results  Component Value Date   CREATININE 0.98 07/07/2014   PSA history:  8.9 ng/mL on 09/10/2013  8.0 ng/mL on 10/12/2013  26.3 ng/mL on 05/28/2014   Assessment & Plan:    1. Elevated PSA:   We will obtain a PSA today.  Because of his rib and hip pain, I will obtain a bone scan to look for metastasis.  - PSA  2. Urinary retention:   Patient is managing with CIC.  He has increase his cathing to 7 to 8 times daily.  We will obtain a specimen when he returns for his bone scan report.  3.  BPH with retention:   Patient is on maximal medicinal therapy with tamsulosin and finasteride.  He is managing with CIC at this time.  If bone mets are discovered on the bone scan, he may pursue ADT and his prostate may further reduce in size.  We will readdress once the bone scan results are available.     Return for bone scan report.  Zara Council, [facility] [address] [place], [place] [address] [phone]

## 2014-12-03 ENCOUNTER — Telehealth: Payer: Self-pay | Admitting: *Deleted

## 2014-12-03 LAB — PSA: Prostate Specific Ag, Serum: 753 ng/mL — ABNORMAL HIGH (ref 0.0–4.0)

## 2014-12-03 NOTE — Telephone Encounter (Signed)
-----   Message from Nori Riis, PA-C sent at 12/03/2014 11:45 AM EDT ----- This patient has not been notified of his PSA results, but I would like to start the process for the [facility] approval.

## 2014-12-03 NOTE — Telephone Encounter (Signed)
Will Give Albert Fritz information to start [name].

## 2014-12-04 ENCOUNTER — Telehealth: Payer: Self-pay

## 2014-12-04 ENCOUNTER — Other Ambulatory Visit: Payer: Self-pay | Admitting: Urology

## 2014-12-04 DIAGNOSIS — R972 Elevated prostate specific antigen [PSA]: Secondary | ICD-10-CM

## 2014-12-04 NOTE — Telephone Encounter (Signed)
-----   Message from Nori Riis, PA-C sent at 12/04/2014  8:35 AM EDT ----- Patient's son has been notified of the PSA results.  He would like to be contacted regarding the appointment for the bone scan and the CT scan.

## 2014-12-05 DIAGNOSIS — R339 Retention of urine, unspecified: Secondary | ICD-10-CM | POA: Insufficient documentation

## 2014-12-05 DIAGNOSIS — N401 Enlarged prostate with lower urinary tract symptoms: Secondary | ICD-10-CM | POA: Insufficient documentation

## 2014-12-05 DIAGNOSIS — R972 Elevated prostate specific antigen [PSA]: Secondary | ICD-10-CM | POA: Insufficient documentation

## 2014-12-05 DIAGNOSIS — R338 Other retention of urine: Secondary | ICD-10-CM

## 2014-12-09 NOTE — Telephone Encounter (Signed)
I have spoken with Albert Fritz And let him know that the Bone scan and Ct scan that we order ed has been approved and they would be calling him to make these appts. He will then call me to make the follow up appt for results.  Thanks, Sharyn Lull

## 2014-12-23 ENCOUNTER — Ambulatory Visit
Admission: RE | Admit: 2014-12-23 | Discharge: 2014-12-23 | Disposition: A | Discharge status: Home / Self Care | Payer: Medicare HMO | Source: Ambulatory Visit | Attending: Urology | Admitting: Urology

## 2014-12-23 ENCOUNTER — Encounter
Admission: RE | Admit: 2014-12-23 | Discharge: 2014-12-23 | Disposition: A | Discharge status: Home / Self Care | Payer: Medicare HMO | Source: Ambulatory Visit | Attending: Urology | Admitting: Urology

## 2014-12-23 DIAGNOSIS — R972 Elevated prostate specific antigen [PSA]: Secondary | ICD-10-CM | POA: Insufficient documentation

## 2014-12-23 DIAGNOSIS — C7951 Secondary malignant neoplasm of bone: Secondary | ICD-10-CM | POA: Insufficient documentation

## 2014-12-23 DIAGNOSIS — R918 Other nonspecific abnormal finding of lung field: Secondary | ICD-10-CM | POA: Diagnosis not present

## 2014-12-23 DIAGNOSIS — K7460 Unspecified cirrhosis of liver: Secondary | ICD-10-CM | POA: Diagnosis not present

## 2014-12-23 DIAGNOSIS — I2510 Atherosclerotic heart disease of native coronary artery without angina pectoris: Secondary | ICD-10-CM | POA: Insufficient documentation

## 2014-12-23 DIAGNOSIS — R935 Abnormal findings on diagnostic imaging of other abdominal regions, including retroperitoneum: Secondary | ICD-10-CM | POA: Diagnosis not present

## 2014-12-23 LAB — POCT I-STAT CREATININE: CREATININE: 0.9 mg/dL (ref 0.61–1.24)

## 2014-12-23 MED ORDER — IOHEXOL 300 MG/ML  SOLN
100.0000 mL | Freq: Once | INTRAMUSCULAR | Status: AC | PRN
  Administered 2014-12-23: 100 mL via INTRAVENOUS

## 2014-12-23 MED ORDER — TECHNETIUM TC 99M MEDRONATE IV KIT
23.1900 | PACK | Freq: Once | INTRAVENOUS | Status: AC | PRN
  Administered 2014-12-23: 23.19 via INTRAVENOUS

## 2014-12-25 NOTE — Telephone Encounter (Signed)
Past patient been approved for the [place]?  He has an upcoming appointment at the end of October.

## 2014-12-30 ENCOUNTER — Encounter: Payer: Self-pay | Admitting: Urology

## 2014-12-30 ENCOUNTER — Ambulatory Visit (INDEPENDENT_AMBULATORY_CARE_PROVIDER_SITE_OTHER): Payer: Medicare HMO | Admitting: Urology

## 2014-12-30 VITALS — BP 111/71 | HR 99 | Ht 75.0 in | Wt 183.1 lb

## 2014-12-30 DIAGNOSIS — C61 Malignant neoplasm of prostate: Secondary | ICD-10-CM

## 2014-12-30 DIAGNOSIS — C7951 Secondary malignant neoplasm of bone: Secondary | ICD-10-CM | POA: Diagnosis not present

## 2014-12-30 MED ORDER — DEGARELIX ACETATE 120 MG ~~LOC~~ SOLR
240.0000 mg | Freq: Once | SUBCUTANEOUS | Status: AC
  Administered 2014-12-30: 240 mg via SUBCUTANEOUS

## 2014-12-30 NOTE — Progress Notes (Signed)
12/30/2014 10:09 PM   Albert Fritz 1929-03-05 [id number]  Referring provider: Marguerita Merles, MD [place], [place] [address]  Chief Complaint  Patient presents with  . Prostate Cancer    f/u visit     HPI: Patient is an 81 year old white male who was found to have a PSA level of 753 ng/mL on 12/02/2014 who underwent a bone scan and CT scan to assess for prostatic metastases. He presents today to discuss those results.  Previous history Patient initially presented to [place] 1 year ago after an episode of acute urinary retention.  He was initiated on maximal medical therapy with tamsulosin and finasteride, but he was still unable to void on his own. He did not want to undergo a surgical procedure, so he was instructed on self catheterization technique.  He started developing dizziness, so the tamsulosin was discontinued.  He was cathing himself 4-5 times daily.  When he presented to [place] last summer, his PSA was in the 8 range.  Earlier this spurring, it had risen to 26.3. It was uncertain at that time whether this was due to his CIC and he did not want to undergo a biopsy.  A CT scan performed in May of this year during a visit to the emergency room for abdominal pain noted an irregular soft tissue mass within the base of the bladder.  His primary care obtain the PSA in May of this year and it was found to be 91. He was then evaluated at [facility] in July and a biopsy of his prostate was recommended, but he was lost to follow-up.  He then presented to [place] in late since September complaining of rib and left hip pain. At that time a repeated PSA was obtained and he was scheduled for a bone scan and CT scan.  CT scan noted the development of extensive osseous metastases, the redemonstration of asymmetric soft tissue density unit with and/or adjacent to the urinary bladder, developing pelvic adenopathy, cirrhosis and an indeterminate right lung base nodule.  Bone scan noted  extensive involvement of the spine, pelvis, ribs and both hips. Also bilateral shoulder involvement with the right greater than the left and involvement of the manubrium.  I have reviewed the bone scan and CT scan.  Today, the patient presents with his son and states he feels weak and tired.  He has slight nausea but denies vomiting. He also denies any fevers or chills.  He is still having pain in his ribs and left hip.     PMH: Past Medical History  Diagnosis Date  . Diabetes mellitus without complication ([place])   . Hypertension   . BPH (benign prostatic hyperplasia)   . Hyperlipidemia   . Arthritis   . Anxiety   . Cataract   . History of kidney stones   . OA (osteoarthritis)   . Prostatitis   . Urinary retention   . Elevated PSA   . UTI (lower urinary tract infection)     Surgical History: Past Surgical History  Procedure Laterality Date  . Back surgery    . Hernia repair    . Back surgery    . Cataract extraction      Home Medications:    Medication List       This list is accurate as of: 12/30/14 11:59 PM.  Always use your most recent med list.               atorvastatin  10 MG tablet  Commonly known as:  LIPITOR  Take 10 mg by mouth daily.     doxazosin 2 MG tablet  Commonly known as:  CARDURA  Take 2 mg by mouth daily.     finasteride 5 MG tablet  Commonly known as:  PROSCAR  Take 5 mg by mouth daily.     glipiZIDE 5 MG tablet  Commonly known as:  GLUCOTROL  Take 5 mg by mouth daily before breakfast.     hydrochlorothiazide 12.5 MG capsule  Commonly known as:  MICROZIDE  Take 12.5 mg by mouth daily.     metFORMIN 500 MG (MOD) 24 hr tablet  Commonly known as:  GLUMETZA  Take 500 mg by mouth daily with breakfast.     ondansetron 4 MG tablet  Commonly known as:  ZOFRAN  Take 1 tablet (4 mg total) by mouth daily as needed for nausea or vomiting.     quinapril 20 MG tablet  Commonly known as:  ACCUPRIL  Take 20 mg by mouth at bedtime.      tamsulosin 0.4 MG Caps capsule  Commonly known as:  FLOMAX  Take 0.4 mg by mouth daily after supper.     Vitamin D (Ergocalciferol) 50000 UNITS Caps capsule  Commonly known as:  DRISDOL  Take 50,000 Units by mouth every 7 (seven) days.        Allergies: No Known Allergies  Family History: Family History  Problem Relation Age of Onset  . Throat cancer Father     Social History:  reports that he has quit smoking. His smoking use included Cigarettes. He has never used smokeless tobacco. He reports that he does not drink alcohol or use illicit drugs.  ROS: UROLOGY Frequent Urination?: No Hard to postpone urination?: No Burning/pain with urination?: No Get up at night to urinate?: No Leakage of urine?: No Urine stream starts and stops?: No Trouble starting stream?: No Do you have to strain to urinate?: No Blood in urine?: No Urinary tract infection?: No Sexually transmitted disease?: No Injury to kidneys or bladder?: No Painful intercourse?: No Weak stream?: No Erection problems?: No Penile pain?: No  Gastrointestinal Nausea?: No Vomiting?: No Indigestion/heartburn?: No Diarrhea?: No Constipation?: No  Constitutional Fever: No Night sweats?: No Weight loss?: No Fatigue?: No  Skin Skin rash/lesions?: No Itching?: No  Eyes Blurred vision?: No Double vision?: No  Ears/Nose/Throat Sore throat?: No Sinus problems?: No  Hematologic/Lymphatic Swollen glands?: No Easy bruising?: No  Cardiovascular Leg swelling?: No Chest pain?: No  Respiratory Cough?: No Shortness of breath?: No  Endocrine Excessive thirst?: No  Musculoskeletal Back pain?: No Joint pain?: No  Neurological Headaches?: No Dizziness?: No  Psychologic Depression?: No Anxiety?: No  Physical Exam: BP 111/71 mmHg  Pulse 99  Ht 6\' 3"  (1.905 m)  Wt 183 lb 1.6 oz (83.054 kg)  BMI 22.89 kg/m2  Constitutional:  Alert and oriented, No acute distress. HEENT: [place] AT, moist mucus  membranes.  Trachea midline, no masses. Cardiovascular: No clubbing, cyanosis, or edema. Respiratory: Normal respiratory effort, no increased work of breathing. GI: Abdomen is soft, nontender, nondistended, no abdominal masses GU: No CVA tenderness.  Skin: No rashes, bruises or suspicious lesions. Lymph: No cervical or inguinal adenopathy. Neurologic: Grossly intact, no focal deficits, moving all 4 extremities. Psychiatric: Normal mood and affect.  Laboratory Data: Lab Results  Component Value Date   WBC 8.9 07/07/2014   HGB 14.5 07/07/2014   HCT 44.9 07/07/2014   MCV 91.0 07/07/2014  PLT 197 07/07/2014    Lab Results  Component Value Date   CREATININE 0.90 12/23/2014    Lab Results  Component Value Date   PSA 753.0* 12/02/2014      Pertinent Imaging: Study Result     CLINICAL DATA: Six-month history of right arm pain.  EXAM: NUCLEAR MEDICINE WHOLE BODY BONE SCAN  TECHNIQUE: Whole body anterior and posterior images were obtained approximately 3 hours after intravenous injection of radiopharmaceutical.  RADIOPHARMACEUTICALS: 23.2 mCi Technetium-34m MDP IV  COMPARISON: CT scan 12/23/2014  FINDINGS: Diffuse osseous metastatic disease is present and correlates with the CT scan from today. Extensive involvement of the spine, pelvis, ribs and both hips. There is also bilateral shoulder involvement, right greater than left and involvement of the manubrium.  IMPRESSION: Diffuse osseous metastatic disease correlating with the CT scan from today.   Electronically Signed  By: Marijo Sanes [name].  On: 12/23/2014 15:05   CLINICAL DATA: Elevated PSA and urinary retention. Dementia. History of prostatitis.  EXAM: CT ABDOMEN AND PELVIS WITHOUT AND WITH CONTRAST  TECHNIQUE: Multidetector CT imaging of the abdomen and pelvis was performed following the standard protocol before and following the bolus administration of intravenous  contrast.  CONTRAST: 131mL OMNIPAQUE IOHEXOL 300 MG/ML SOLN  COMPARISON: 07/07/2014  FINDINGS: Lower chest: 4 mm anterior right lung base nodule on image/series 7/6. Mild subpleural reticulation. Mild cardiomegaly with coronary artery atherosclerosis. No pericardial or pleural effusion.  Hepatobiliary: Moderate cirrhosis, as evidenced by irregular hepatic capsule. No focal liver lesion. Dependent stones or sludge within the gallbladder. No surrounding inflammation.  Pancreas: Fatty replacement, primarily in the pancreatic head and uncinate process. No duct dilatation or mass.  Spleen: Normal in size, without focal abnormality.  Adrenals/Urinary Tract: Normal adrenal glands. No renal calculi or hydronephrosis. Renal cortical thinning is likely within normal variation for age. 7 mm interpolar left renal cyst. No suspicious renal mass.  The bladder wall is mildly thickened, possibly secondary to incomplete distension. Laterally and greater on the right than left on image/series 89/4. There is irregular soft tissue density which is eccentric right and likely impresses into the urinary bladder. Example on image/series 90/4. This is grossly similar to on the prior.  Stomach/Bowel: Normal stomach, without wall thickening. Normal colon, appendix, and terminal ileum. Normal small bowel.  Vascular/Lymphatic: Aortic and branch vessel atherosclerosis. Focal outpouching of the abdominal aorta, infrarenal segment. This measures approximately 7 mm at the 10 o'clock position on image/series 46/4. This is unchanged. No portal venous hypertension identified. Patent hepatic and portal veins.  Right common iliac artery ectasia at 1.6 cm. No retroperitoneal or retrocrural adenopathy. Mildly enlarged right external iliac node measures 1.2 cm on image/series 76/4 and is new. a 7 mm left external iliac node is new on image/series 77/4  Reproductive: Mild prostatomegaly. Possible  extension and indentation in the urinary bladder.  Other: No significant free fluid. Fat containing right paracentral ventral abdominal wall hernia.  Musculoskeletal: New or progressive widespread osseous metastasis, sclerotic. Example 1.7 cm right iliac lesion on image/series 69/4. Vertebral body lesions including at T9, T10, T12, L2, and L5.  IMPRESSION: 1. Development of extensive osseous metastasis, presumably from prostate primary. 2. Redemonstration of asymmetric soft tissue density either within or adjacent to the urinary bladder. This most likely represents asymmetric enlargement of the median lobe of the prostate. Concurrent Bladder primary cannot be excluded. Correlate with hematuria and possibly cystoscopy. There is equivocal mild bladder wall thickening which could relate to a component of outlet obstruction. No  evidence of obstructive uropathy or other explanation for urinary retention. 3. Developing pelvic adenopathy, likely related to metastatic prostate carcinoma. 4. Cirrhosis. 5. Focal outpouching involving the infrarenal aorta could represent penetrating ulcer or saccular aneurysm. 6. Atherosclerosis, including within the coronary arteries. 7. Indeterminate right lung base nodule. 8. Interstitial prominence at the lung bases, nonspecific in this age group.   Electronically Signed  By: Abigail Miyamoto [name].  On: 12/23/2014 14:00  Assessment & Plan:    1. Stage IV prostate cancer:   Patient with pelvic adenopathy and extensive osseous metastasis and a PSA of 753 ng/mL indicative of a clinical stage IV prostate cancer.  We discussed the risk and benefit of initiating ADT therapy.  They are agreeable to starting the Firmagon.  He is initiated on ADT therapy today with [place].  We will also refer him back to the cancer center.  He may be a candidate for bone reabsorption therapy.  We'll see him back in 1 month for his second Firmagon injection.   Return in  about 1 month (around 01/30/2015) for Firmagon injection.  Zara Council, [facility] [address], [place], [place] [address] [phone]

## 2014-12-30 NOTE — Progress Notes (Signed)
Firmagon Sub Q Injection  Due to Prostate Cancer patient is present today for a Firmagon Injection.   Medication: Mills Koller (Degarelix)  Dose: 240 mg Location: right /Left upper abdomen Lot: [id number]  Exp: 06/2016  Patient tolerated well, no complications were noted  Performed by: KR,SW  Follow up: 28 days

## 2014-12-31 ENCOUNTER — Encounter: Payer: Self-pay | Admitting: Urology

## 2014-12-31 DIAGNOSIS — C7951 Secondary malignant neoplasm of bone: Principal | ICD-10-CM

## 2014-12-31 DIAGNOSIS — C61 Malignant neoplasm of prostate: Secondary | ICD-10-CM | POA: Insufficient documentation

## 2015-01-02 ENCOUNTER — Telehealth: Payer: Self-pay

## 2015-01-02 NOTE — Telephone Encounter (Signed)
  Oncology Nurse Navigator Documentation  Referral date to RadOnc/MedOnc: 01/02/15 (01/02/15 1400) Navigator Encounter Type: Telephone (01/02/15 1400)         Interventions: Coordination of Care (01/02/15 1400)            Time Spent with Patient: 15 (01/02/15 1400)   Spoke with son on the phone. Prefers a Monday appt so he can bring him. Appt arranged for 11/7 at 0800. Readback performed.

## 2015-01-09 ENCOUNTER — Ambulatory Visit: Payer: Medicare HMO | Admitting: Oncology

## 2015-01-09 ENCOUNTER — Other Ambulatory Visit: Payer: Self-pay | Admitting: Family Medicine

## 2015-01-09 DIAGNOSIS — I159 Secondary hypertension, unspecified: Secondary | ICD-10-CM

## 2015-01-09 DIAGNOSIS — C61 Malignant neoplasm of prostate: Secondary | ICD-10-CM

## 2015-01-09 DIAGNOSIS — R200 Anesthesia of skin: Secondary | ICD-10-CM

## 2015-01-09 DIAGNOSIS — R202 Paresthesia of skin: Secondary | ICD-10-CM

## 2015-01-09 DIAGNOSIS — M542 Cervicalgia: Secondary | ICD-10-CM

## 2015-01-13 ENCOUNTER — Inpatient Hospital Stay: Payer: Medicare HMO

## 2015-01-13 ENCOUNTER — Inpatient Hospital Stay: Payer: Medicare HMO | Attending: Oncology | Admitting: Oncology

## 2015-01-13 ENCOUNTER — Encounter: Payer: Self-pay | Admitting: Oncology

## 2015-01-13 VITALS — BP 100/59 | HR 76 | Temp 96.4°F | Wt 188.2 lb

## 2015-01-13 DIAGNOSIS — Z87891 Personal history of nicotine dependence: Secondary | ICD-10-CM | POA: Diagnosis not present

## 2015-01-13 DIAGNOSIS — F329 Major depressive disorder, single episode, unspecified: Secondary | ICD-10-CM | POA: Diagnosis not present

## 2015-01-13 DIAGNOSIS — K439 Ventral hernia without obstruction or gangrene: Secondary | ICD-10-CM

## 2015-01-13 DIAGNOSIS — E785 Hyperlipidemia, unspecified: Secondary | ICD-10-CM | POA: Diagnosis not present

## 2015-01-13 DIAGNOSIS — R339 Retention of urine, unspecified: Secondary | ICD-10-CM

## 2015-01-13 DIAGNOSIS — K8020 Calculus of gallbladder without cholecystitis without obstruction: Secondary | ICD-10-CM | POA: Insufficient documentation

## 2015-01-13 DIAGNOSIS — F419 Anxiety disorder, unspecified: Secondary | ICD-10-CM

## 2015-01-13 DIAGNOSIS — M1990 Unspecified osteoarthritis, unspecified site: Secondary | ICD-10-CM | POA: Insufficient documentation

## 2015-01-13 DIAGNOSIS — M129 Arthropathy, unspecified: Secondary | ICD-10-CM

## 2015-01-13 DIAGNOSIS — I10 Essential (primary) hypertension: Secondary | ICD-10-CM | POA: Diagnosis not present

## 2015-01-13 DIAGNOSIS — C7951 Secondary malignant neoplasm of bone: Secondary | ICD-10-CM | POA: Diagnosis not present

## 2015-01-13 DIAGNOSIS — N329 Bladder disorder, unspecified: Secondary | ICD-10-CM | POA: Insufficient documentation

## 2015-01-13 DIAGNOSIS — C61 Malignant neoplasm of prostate: Secondary | ICD-10-CM

## 2015-01-13 DIAGNOSIS — N281 Cyst of kidney, acquired: Secondary | ICD-10-CM | POA: Insufficient documentation

## 2015-01-13 DIAGNOSIS — K7460 Unspecified cirrhosis of liver: Secondary | ICD-10-CM | POA: Insufficient documentation

## 2015-01-13 DIAGNOSIS — H9190 Unspecified hearing loss, unspecified ear: Secondary | ICD-10-CM | POA: Diagnosis not present

## 2015-01-13 DIAGNOSIS — E119 Type 2 diabetes mellitus without complications: Secondary | ICD-10-CM | POA: Diagnosis not present

## 2015-01-13 DIAGNOSIS — N400 Enlarged prostate without lower urinary tract symptoms: Secondary | ICD-10-CM | POA: Diagnosis not present

## 2015-01-13 DIAGNOSIS — I2510 Atherosclerotic heart disease of native coronary artery without angina pectoris: Secondary | ICD-10-CM

## 2015-01-13 DIAGNOSIS — C779 Secondary and unspecified malignant neoplasm of lymph node, unspecified: Secondary | ICD-10-CM | POA: Diagnosis not present

## 2015-01-13 DIAGNOSIS — F0390 Unspecified dementia without behavioral disturbance: Secondary | ICD-10-CM | POA: Insufficient documentation

## 2015-01-13 DIAGNOSIS — R972 Elevated prostate specific antigen [PSA]: Secondary | ICD-10-CM | POA: Diagnosis not present

## 2015-01-13 DIAGNOSIS — I4891 Unspecified atrial fibrillation: Secondary | ICD-10-CM | POA: Diagnosis not present

## 2015-01-13 DIAGNOSIS — R0602 Shortness of breath: Secondary | ICD-10-CM | POA: Insufficient documentation

## 2015-01-13 DIAGNOSIS — N390 Urinary tract infection, site not specified: Secondary | ICD-10-CM | POA: Insufficient documentation

## 2015-01-13 DIAGNOSIS — Z87442 Personal history of urinary calculi: Secondary | ICD-10-CM

## 2015-01-13 LAB — CBC WITH DIFFERENTIAL/PLATELET
BASOS ABS: 0.1 10*3/uL (ref 0–0.1)
BASOS PCT: 1 %
EOS ABS: 0.2 10*3/uL (ref 0–0.7)
EOS PCT: 2 %
HCT: 37 % — ABNORMAL LOW (ref 40.0–52.0)
Hemoglobin: 12 g/dL — ABNORMAL LOW (ref 13.0–18.0)
LYMPHS ABS: 5.6 10*3/uL — AB (ref 1.0–3.6)
LYMPHS PCT: 51 %
MCH: 28.2 pg (ref 26.0–34.0)
MCHC: 32.4 g/dL (ref 32.0–36.0)
MCV: 87.2 fL (ref 80.0–100.0)
MONO ABS: 0.9 10*3/uL (ref 0.2–1.0)
MONOS PCT: 8 %
Neutro Abs: 4.1 10*3/uL (ref 1.4–6.5)
Neutrophils Relative %: 38 %
PLATELETS: 282 10*3/uL (ref 150–440)
RBC: 4.25 MIL/uL — ABNORMAL LOW (ref 4.40–5.90)
RDW: 15.6 % — AB (ref 11.5–14.5)
WBC: 10.8 10*3/uL — ABNORMAL HIGH (ref 3.8–10.6)

## 2015-01-13 LAB — COMPREHENSIVE METABOLIC PANEL
ALBUMIN: 3.3 g/dL — AB (ref 3.5–5.0)
ALT: 25 U/L (ref 17–63)
AST: 38 U/L (ref 15–41)
Alkaline Phosphatase: 1743 U/L — ABNORMAL HIGH (ref 38–126)
Anion gap: 6 (ref 5–15)
BUN: 28 mg/dL — AB (ref 6–20)
CHLORIDE: 102 mmol/L (ref 101–111)
CO2: 27 mmol/L (ref 22–32)
Calcium: 8.5 mg/dL — ABNORMAL LOW (ref 8.9–10.3)
Creatinine, Ser: 0.83 mg/dL (ref 0.61–1.24)
GFR calc Af Amer: >60 mL/min (ref >60)
Glucose, Bld: 146 mg/dL — ABNORMAL HIGH (ref 65–99)
POTASSIUM: 3.9 mmol/L (ref 3.5–5.1)
Sodium: 135 mmol/L (ref 135–145)
Total Bilirubin: 0.7 mg/dL (ref 0.3–1.2)
Total Protein: 7.4 g/dL (ref 6.5–8.1)

## 2015-01-13 NOTE — Telephone Encounter (Signed)
That is fine with me.

## 2015-01-13 NOTE — Telephone Encounter (Signed)
-----   Message from Clent Jacks, RN sent at 01/13/2015 11:44 AM EST ----- Dr Oliva Bustard saw him today and is going to start on Xgeva. Wanted to know if he can get firmagon here to save son a trip with him. I told son I would check with you.

## 2015-01-13 NOTE — Progress Notes (Signed)
Patient here for new evaluation of prostate cancer.  Known to Dr. Oliva Bustard.  Placed yellow bracelet on patient and educated him to wear it to all appts, even labs only.

## 2015-01-13 NOTE — Progress Notes (Signed)
Albert Fritz @ [facility] Telephone:([phone]  Fax:(336) [place] OB: Dec 23, 1928  MR#: [id number]  ASN#:[id number]  Patient Care Team: Marguerita Merles, MD as PCP - General (Family Medicine)  CHIEF COMPLAINT:  Chief Complaint  Patient presents with  . 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 Prostate cancer metastatic to bone (HCC) 185 C61 citalopram (CELEXA) 10 MG tablet   198.5 C79.51 CBC with Differential     Comprehensive metabolic panel       Oncology Flowsheet 07/07/2014 12/30/2014  degarelix (FIRMAGON) [place] - 240 mg  ondansetron (ZOFRAN) IV 4 mg -    INTERVAL HISTORY:   81 year old gentleman was very poor historian.  Patient was initially seen in [place] by Dr. Baruch Gouty because of clinical diagnoses of prostate cancer and high PSA.  PSA at that time was 91.   Patient refused any prostate biopsy and was lost for follow-up. Patient's condition kept on declining.  Patient denies any pain.  Recently patient had been evaluated by urologist's office and PSA was found to be 753 so a CT scan of bone scan has been ordered which has been reviewed independently sows extensive bone metastases.  Soft tissue mass in the base of the bladder as well as multiple lymph node  Patient still did not have any biopsy done in urologist's office and started androgen deprivation therapy Patient was referred to me for further evaluation and treatment consideration Was accompanied by his son Patient lives with his companion of 20 years Denies any bony pain or difficulty passing urine No hematuria.  Appetite is poor. Talking with the help of walker at home  REVIEW OF SYSTEMS:   Gen. status: Poor historian.   Does not complain too much. HEENT: Hard of hearing.  No headache or dizziness Lungs: Increasing shortness of breath on exertion Cardiac history of atrial fibrillation and flutter Detailed history not known at present time patient is [facility] GI: No nausea no vomiting no diarrhea GU: No dysuria or hematuria Musculoskeletal system no bony pain Skin: No rash Neurological system no headache no dizziness patient has some cognitive difficulties compatible with early dementia without any focal symptoms Lower extremity trace edema Psychiatric system patient is on Celexa for mild depression Endocrine system: Type 2 diabetes and hypertension  As per HPI. Otherwise, a complete review of systems is negatve.  PAST MEDICAL HISTORY: Past Medical History  Diagnosis Date  . Diabetes mellitus without complication ([place])   . Hypertension   . BPH (benign prostatic hyperplasia)   . Hyperlipidemia   . Arthritis   . Anxiety   . Cataract   . History of kidney stones   . OA (osteoarthritis)   . Prostatitis   . Urinary retention   . Elevated PSA   . UTI (lower urinary tract infection)     PAST SURGICAL HISTORY: Past Surgical History  Procedure Laterality Date  . Back surgery    . Hernia repair    . Back surgery    . Cataract extraction      FAMILY HISTORY Family History  Problem Relation Age of Onset  . Throat cancer Father  ADVANCED DIRECTIVES:   Patient does not have any living will or healthcare power of attorney.  Information was given .  Available resources had been discussed.  We will follow-up on subsequent appointments regarding this issue HEALTH MAINTENANCE: Social History  Substance Use Topics  . Smoking status: Former Smoker    Types: Cigarettes  . Smokeless tobacco: Never Used     Comment: quit 30 years ago  . Alcohol Use: No   All medication at been reviewed    OBJECTIVE: PHYSICAL EXAM: Gen. status: Patient is hard of hearing.   Alert and oriented.  Not any acute distress performance status is 2 HEENT: No evidence of stomatitis.  Or jaundice. Lymphatic system: No palpable supraclavicular cervical axillary  Adenopathy. There might be some fullness in the right inguinal area suggesting small palpable lymph node Lungs: Diminished air entry on both sides. Heart: Irregular heart sounds GI: Abdomen is soft no ascites liver and spleen not palpable Examination of the skin revealed no evidence of significant rashes, suspicious appearing nevi or other concerning lesions. Neurological system: No focal signs   Filed Vitals:   01/13/15 0831  BP: 100/59  Pulse: 76  Temp: 96.4 F (35.8 C)     Body mass index is 23.53 kg/(m^2).    ECOG FS:2 - Symptomatic, <50% confined to bed  LAB RESULTS:  Appointment on 01/13/2015  Component Date Value Ref Range Status  . WBC 01/13/2015 10.8* 3.8 - 10.6 K/uL Final  . RBC 01/13/2015 4.25* 4.40 - 5.90 MIL/uL Final  . Hemoglobin 01/13/2015 12.0* 13.0 - 18.0 g/dL Final  . HCT 01/13/2015 37.0* 40.0 - 52.0 % Final  . MCV 01/13/2015 87.2  80.0 - 100.0 fL Final  . MCH 01/13/2015 28.2  26.0 - 34.0 pg Final  . MCHC 01/13/2015 32.4  32.0 - 36.0 g/dL Final  . RDW 01/13/2015 15.6* 11.5 - 14.5 % Final  . Platelets 01/13/2015 282  150 - 440 K/uL Final  . Neutrophils Relative % 01/13/2015 38   Final  . Neutro Abs 01/13/2015 4.1  1.4 - 6.5 K/uL Final  . Lymphocytes Relative 01/13/2015 51   Final  . Lymphs Abs 01/13/2015 5.6* 1.0 - 3.6 K/uL Final  . Monocytes Relative 01/13/2015 8   Final  . Monocytes Absolute 01/13/2015 0.9  0.2 - 1.0 K/uL Final  . Eosinophils Relative 01/13/2015 2   Final  . Eosinophils Absolute 01/13/2015 0.2  0 - 0.7 K/uL Final  . Basophils Relative 01/13/2015 1   Final  . Basophils Absolute 01/13/2015 0.1  0 - 0.1 K/uL Final  . Sodium 01/13/2015 135  135 - 145 mmol/L Final  . Potassium 01/13/2015 3.9  3.5 - 5.1 mmol/L Final  . Chloride 01/13/2015 102  101 - 111 mmol/L Final   . CO2 01/13/2015 27  22 - 32 mmol/L Final  . Glucose, Bld 01/13/2015 146* 65 - 99 mg/dL Final  . BUN 01/13/2015 28* 6 - 20 mg/dL Final  . Creatinine, Ser 01/13/2015 0.83  0.61 - 1.24 mg/dL Final  . Calcium 01/13/2015 8.5* 8.9 - 10.3 mg/dL Final  . Total Protein 01/13/2015 7.4  6.5 - 8.1 g/dL Final  . Albumin 01/13/2015 3.3* 3.5 - 5.0 g/dL Final  . AST 01/13/2015 38  15 - 41 U/L Final  . ALT 01/13/2015 25  17 - 63 U/L Final  . Alkaline Phosphatase 01/13/2015 1743* 38 - 126 U/L Final   RESULTS CONFIRMED BY MANUAL DILUTION  . Total Bilirubin 01/13/2015 0.7  0.3 - 1.2  mg/dL Final  . GFR calc non Af Amer 01/13/2015 >60  >60 mL/min Final  . GFR calc Af Amer 01/13/2015 >60  >60 mL/min Final   Comment: (NOTE) The eGFR has been calculated using the CKD EPI equation. This calculation has not been validated in all clinical situations. eGFR's persistently <60 mL/min signify possible Chronic Kidney Disease.   . Anion gap 01/13/2015 6  5 - 15 Final     STUDIES: Ct Abdomen Pelvis W Wo Contrast  12/23/2014  CLINICAL DATA:  Elevated PSA and urinary retention. Dementia. History of prostatitis. EXAM: CT ABDOMEN AND PELVIS WITHOUT AND WITH CONTRAST TECHNIQUE: Multidetector CT imaging of the abdomen and pelvis was performed following the standard protocol before and following the bolus administration of intravenous contrast. CONTRAST:  182m OMNIPAQUE IOHEXOL 300 MG/ML  SOLN COMPARISON:  07/07/2014 FINDINGS: Lower chest: 4 mm anterior right lung base nodule on image/series 7/6. Mild subpleural reticulation. Mild cardiomegaly with coronary artery atherosclerosis. No pericardial or pleural effusion. Hepatobiliary: Moderate cirrhosis, as evidenced by irregular hepatic capsule. No focal liver lesion. Dependent stones or sludge within the gallbladder. No surrounding inflammation. Pancreas: Fatty replacement, primarily in the pancreatic head and uncinate process. No duct dilatation or mass. Spleen: Normal in size,  without focal abnormality. Adrenals/Urinary Tract: Normal adrenal glands. No renal calculi or hydronephrosis. Renal cortical thinning is likely within normal variation for age. 7 mm interpolar left renal cyst. No suspicious renal mass. The bladder wall is mildly thickened, possibly secondary to incomplete distension. Laterally and greater on the right than left on image/series 89/4. There is irregular soft tissue density which is eccentric right and likely impresses into the urinary bladder. Example on image/series 90/4. This is grossly similar to on the prior. Stomach/Bowel: Normal stomach, without wall thickening. Normal colon, appendix, and terminal ileum. Normal small bowel. Vascular/Lymphatic: Aortic and branch vessel atherosclerosis. Focal outpouching of the abdominal aorta, infrarenal segment. This measures approximately 7 mm at the 10 o'clock position on image/series 46/4. This is unchanged. No portal venous hypertension identified. Patent hepatic and portal veins. Right common iliac artery ectasia at 1.6 cm. No retroperitoneal or retrocrural adenopathy. Mildly enlarged right external iliac node measures 1.2 cm on image/series 76/4 and is new. a 7 mm left external iliac node is new on image/series 77/4 Reproductive: Mild prostatomegaly. Possible extension and indentation in the urinary bladder. Other: No significant free fluid. Fat containing right paracentral ventral abdominal wall hernia. Musculoskeletal: New or progressive widespread osseous metastasis, sclerotic. Example 1.7 cm right iliac lesion on image/series 69/4. Vertebral body lesions including at T9, T10, T12, L2, and L5. IMPRESSION: 1. Development of extensive osseous metastasis, presumably from prostate primary. 2. Redemonstration of asymmetric soft tissue density either within or adjacent to the urinary bladder. This most likely represents asymmetric enlargement of the median lobe of the prostate. Concurrent Bladder primary cannot be excluded.  Correlate with hematuria and possibly cystoscopy. There is equivocal mild bladder wall thickening which could relate to a component of outlet obstruction. No evidence of obstructive uropathy or other explanation for urinary retention. 3. Developing pelvic adenopathy, likely related to metastatic prostate carcinoma. 4. Cirrhosis. 5. Focal outpouching involving the infrarenal aorta could represent penetrating ulcer or saccular aneurysm. 6.  Atherosclerosis, including within the coronary arteries. 7. Indeterminate right lung base nodule. 8. Interstitial prominence at the lung bases, nonspecific in this age group. Electronically Signed   By: KAbigail MiyamotoM.D.   On: 12/23/2014 14:00   Nm Bone Scan Whole Body  12/23/2014  CLINICAL DATA:  Six-month history of right arm pain. EXAM: NUCLEAR MEDICINE WHOLE BODY BONE SCAN TECHNIQUE: Whole body anterior and posterior images were obtained approximately 3 hours after intravenous injection of radiopharmaceutical. RADIOPHARMACEUTICALS:  23.2 mCi Technetium-42mMDP IV COMPARISON:  CT scan 12/23/2014 FINDINGS: Diffuse osseous metastatic disease is present and correlates with the CT scan from today. Extensive involvement of the spine, pelvis, ribs and both hips. There is also bilateral shoulder involvement, right greater than left and involvement of the manubrium. IMPRESSION: Diffuse osseous metastatic disease correlating with the CT scan from today. Electronically Signed   By: PMarijo SanesM.D.   On: 12/23/2014 15:05    ASSESSMENT:  CT scan and bone scan has been reviewed shows extensive bone metastases.  Soft tissue mass at the base of the bladder PSA 753 which is increased from the PSA which was in May of 2016 was 91 Clinical picture is suggestive of prostate cancer with extensive metastases We discussed possibility of biopsying tumor patient probably still reluctant for biopsy however son says he is going to try to get biopsy done Patient will be followed by urologist  regarding biopsying He patient refuses biopsy and still based on clinical situation [facility] as also initiate Xgeva because of extensive bone metastases Various side effects of all this treatment has been discussed. PET scan has been reviewed with the patient and family.  Patient is not having any bone pain and there is no role for radiation therapy at present time   Next dose of FIRMAGON and XGEVA has been scheduled on November 21 either here or in urologist's office depending on patient's choice  PLAN:  No problem-specific assessment & plan notes found for this encounter.   Patient expressed understanding and was in agreement with this plan. He also understands that He can call clinic at any time with any questions, concerns, or complaints.    Prostate cancer metastatic to bone ([facility]   Staging form: Prostate, AJCC 7th Edition     Clinical: Stage IV (T3b, N1, M1, PSA: 20 or greater) - Signed by JForest Gleason MD on 01/13/2015   JForest Gleason MD   01/13/2015 1:12 PM

## 2015-01-13 NOTE — Progress Notes (Signed)
  Oncology Nurse Navigator Documentation    Navigator Encounter Type: Initial MedOnc (01/13/15 0900) Patient Visit Type: Medonc (01/13/15 0900) Treatment Phase: Abnormal Scans (PSA 732) (01/13/15 0900)     Interventions: Coordination of Care (01/13/15 0900)            Time Spent with Patient: 41 (01/13/15 0900)   Spoke further with son regarding biopsy of prostate. He states he knows his father will continue to refuse biopsy. He will start Xgeva. Will coordinate with [facility] to get [place] transferred here so son only has to make one trip and he can get all treatment on the same day. Son needs Monday morning appts for appts and treatment visits. Will notify son once coordination is arranged and appt set up. He is in agreeance with this plan. Provided him with my contact information for any further questions or concerns.

## 2015-01-15 ENCOUNTER — Telehealth: Payer: Self-pay

## 2015-01-15 NOTE — Telephone Encounter (Signed)
  Oncology Nurse Navigator Documentation    Navigator Encounter Type: Telephone (01/15/15 1100)                      Time Spent with Patient: 15 (01/15/15 1100)   After receiving approval from [facility] at [facility], Son notified of appt 11/21 at 1345 here at the [facility]. Readback performed.

## 2015-01-22 ENCOUNTER — Ambulatory Visit: Payer: Medicare HMO

## 2015-01-23 ENCOUNTER — Ambulatory Visit: Payer: Medicare HMO

## 2015-01-27 ENCOUNTER — Inpatient Hospital Stay: Payer: Medicare HMO

## 2015-01-27 DIAGNOSIS — C7951 Secondary malignant neoplasm of bone: Principal | ICD-10-CM

## 2015-01-27 DIAGNOSIS — C61 Malignant neoplasm of prostate: Secondary | ICD-10-CM

## 2015-01-27 LAB — COMPREHENSIVE METABOLIC PANEL
ALT: 19 U/L (ref 17–63)
AST: 32 U/L (ref 15–41)
Albumin: 3.1 g/dL — ABNORMAL LOW (ref 3.5–5.0)
Alkaline Phosphatase: 1668 U/L — ABNORMAL HIGH (ref 38–126)
Anion gap: 5 (ref 5–15)
BUN: 20 mg/dL (ref 6–20)
CALCIUM: 8 mg/dL — AB (ref 8.9–10.3)
CHLORIDE: 100 mmol/L — AB (ref 101–111)
CO2: 27 mmol/L (ref 22–32)
CREATININE: 0.88 mg/dL (ref 0.61–1.24)
Glucose, Bld: 121 mg/dL — ABNORMAL HIGH (ref 65–99)
Potassium: 3.9 mmol/L (ref 3.5–5.1)
Sodium: 132 mmol/L — ABNORMAL LOW (ref 135–145)
Total Bilirubin: 0.6 mg/dL (ref 0.3–1.2)
Total Protein: 6.2 g/dL — ABNORMAL LOW (ref 6.5–8.1)

## 2015-01-27 LAB — CBC WITH DIFFERENTIAL/PLATELET
Basophils Absolute: 0 10*3/uL (ref 0–0.1)
Basophils Relative: 1 %
EOS PCT: 2 %
Eosinophils Absolute: 0.2 10*3/uL (ref 0–0.7)
HCT: 33.3 % — ABNORMAL LOW (ref 40.0–52.0)
Hemoglobin: 10.9 g/dL — ABNORMAL LOW (ref 13.0–18.0)
LYMPHS ABS: 4.3 10*3/uL — AB (ref 1.0–3.6)
LYMPHS PCT: 46 %
MCH: 28.6 pg (ref 26.0–34.0)
MCHC: 32.7 g/dL (ref 32.0–36.0)
MCV: 87.5 fL (ref 80.0–100.0)
MONO ABS: 1 10*3/uL (ref 0.2–1.0)
MONOS PCT: 10 %
Neutro Abs: 3.8 10*3/uL (ref 1.4–6.5)
Neutrophils Relative %: 41 %
PLATELETS: 219 10*3/uL (ref 150–440)
RBC: 3.81 MIL/uL — AB (ref 4.40–5.90)
RDW: 16.9 % — ABNORMAL HIGH (ref 11.5–14.5)
WBC: 9.3 10*3/uL (ref 3.8–10.6)

## 2015-01-27 MED ORDER — DENOSUMAB 120 MG/1.7ML ~~LOC~~ SOLN
120.0000 mg | Freq: Once | SUBCUTANEOUS | Status: AC
  Administered 2015-01-27: 120 mg via SUBCUTANEOUS
  Filled 2015-01-27: qty 1.7

## 2015-01-27 MED ORDER — DEGARELIX ACETATE 80 MG ~~LOC~~ SOLR
80.0000 mg | Freq: Once | SUBCUTANEOUS | Status: AC
  Administered 2015-01-27: 80 mg via SUBCUTANEOUS
  Filled 2015-01-27: qty 4

## 2015-01-28 ENCOUNTER — Ambulatory Visit: Payer: Medicare HMO

## 2015-02-03 ENCOUNTER — Ambulatory Visit: Payer: Medicare HMO | Admitting: Urology

## 2015-02-24 ENCOUNTER — Inpatient Hospital Stay: Payer: Medicare HMO

## 2015-02-24 ENCOUNTER — Inpatient Hospital Stay: Payer: Medicare HMO | Attending: Oncology | Admitting: Oncology

## 2015-02-24 ENCOUNTER — Encounter: Payer: Self-pay | Admitting: Oncology

## 2015-02-24 VITALS — BP 131/67 | HR 91 | Temp 97.3°F | Wt 199.4 lb

## 2015-02-24 DIAGNOSIS — C61 Malignant neoplasm of prostate: Secondary | ICD-10-CM

## 2015-02-24 DIAGNOSIS — Z79818 Long term (current) use of other agents affecting estrogen receptors and estrogen levels: Secondary | ICD-10-CM | POA: Insufficient documentation

## 2015-02-24 DIAGNOSIS — C7951 Secondary malignant neoplasm of bone: Secondary | ICD-10-CM | POA: Diagnosis not present

## 2015-02-24 DIAGNOSIS — E119 Type 2 diabetes mellitus without complications: Secondary | ICD-10-CM | POA: Diagnosis not present

## 2015-02-24 DIAGNOSIS — R591 Generalized enlarged lymph nodes: Secondary | ICD-10-CM

## 2015-02-24 DIAGNOSIS — Z87440 Personal history of urinary (tract) infections: Secondary | ICD-10-CM | POA: Diagnosis not present

## 2015-02-24 DIAGNOSIS — E785 Hyperlipidemia, unspecified: Secondary | ICD-10-CM | POA: Diagnosis not present

## 2015-02-24 DIAGNOSIS — M129 Arthropathy, unspecified: Secondary | ICD-10-CM

## 2015-02-24 DIAGNOSIS — Z87891 Personal history of nicotine dependence: Secondary | ICD-10-CM | POA: Insufficient documentation

## 2015-02-24 DIAGNOSIS — I10 Essential (primary) hypertension: Secondary | ICD-10-CM | POA: Insufficient documentation

## 2015-02-24 DIAGNOSIS — Z87442 Personal history of urinary calculi: Secondary | ICD-10-CM | POA: Insufficient documentation

## 2015-02-24 DIAGNOSIS — Z79899 Other long term (current) drug therapy: Secondary | ICD-10-CM | POA: Diagnosis not present

## 2015-02-24 DIAGNOSIS — R7989 Other specified abnormal findings of blood chemistry: Secondary | ICD-10-CM | POA: Diagnosis not present

## 2015-02-24 DIAGNOSIS — Z808 Family history of malignant neoplasm of other organs or systems: Secondary | ICD-10-CM | POA: Diagnosis not present

## 2015-02-24 DIAGNOSIS — M1990 Unspecified osteoarthritis, unspecified site: Secondary | ICD-10-CM | POA: Insufficient documentation

## 2015-02-24 DIAGNOSIS — N329 Bladder disorder, unspecified: Secondary | ICD-10-CM

## 2015-02-24 DIAGNOSIS — F419 Anxiety disorder, unspecified: Secondary | ICD-10-CM | POA: Diagnosis not present

## 2015-02-24 DIAGNOSIS — H918X9 Other specified hearing loss, unspecified ear: Secondary | ICD-10-CM | POA: Insufficient documentation

## 2015-02-24 DIAGNOSIS — R9721 Rising PSA following treatment for malignant neoplasm of prostate: Secondary | ICD-10-CM

## 2015-02-24 DIAGNOSIS — R339 Retention of urine, unspecified: Secondary | ICD-10-CM | POA: Diagnosis not present

## 2015-02-24 LAB — CBC WITH DIFFERENTIAL/PLATELET
BASOS ABS: 0 10*3/uL (ref 0–0.1)
Basophils Relative: 0 %
Eosinophils Absolute: 0.1 10*3/uL (ref 0–0.7)
Eosinophils Relative: 1 %
HEMATOCRIT: 36.8 % — AB (ref 40.0–52.0)
Hemoglobin: 11.8 g/dL — ABNORMAL LOW (ref 13.0–18.0)
LYMPHS ABS: 4.6 10*3/uL — AB (ref 1.0–3.6)
LYMPHS PCT: 47 %
MCH: 28 pg (ref 26.0–34.0)
MCHC: 32.2 g/dL (ref 32.0–36.0)
MCV: 87 fL (ref 80.0–100.0)
MONO ABS: 0.9 10*3/uL (ref 0.2–1.0)
MONOS PCT: 9 %
NEUTROS ABS: 4.3 10*3/uL (ref 1.4–6.5)
Neutrophils Relative %: 43 %
Platelets: 254 10*3/uL (ref 150–440)
RBC: 4.23 MIL/uL — ABNORMAL LOW (ref 4.40–5.90)
RDW: 16.1 % — AB (ref 11.5–14.5)
WBC: 9.9 10*3/uL (ref 3.8–10.6)

## 2015-02-24 LAB — COMPREHENSIVE METABOLIC PANEL
ALT: 27 U/L (ref 17–63)
AST: 34 U/L (ref 15–41)
Albumin: 3.6 g/dL (ref 3.5–5.0)
Alkaline Phosphatase: 635 U/L — ABNORMAL HIGH (ref 38–126)
Anion gap: 4 — ABNORMAL LOW (ref 5–15)
BILIRUBIN TOTAL: 0.6 mg/dL (ref 0.3–1.2)
BUN: 26 mg/dL — AB (ref 6–20)
CALCIUM: 7.8 mg/dL — AB (ref 8.9–10.3)
CO2: 25 mmol/L (ref 22–32)
CREATININE: 0.71 mg/dL (ref 0.61–1.24)
Chloride: 108 mmol/L (ref 101–111)
Glucose, Bld: 140 mg/dL — ABNORMAL HIGH (ref 65–99)
Potassium: 4.5 mmol/L (ref 3.5–5.1)
Sodium: 137 mmol/L (ref 135–145)
TOTAL PROTEIN: 7.3 g/dL (ref 6.5–8.1)

## 2015-02-24 MED ORDER — DENOSUMAB 120 MG/1.7ML ~~LOC~~ SOLN
120.0000 mg | Freq: Once | SUBCUTANEOUS | Status: AC
  Administered 2015-02-24: 120 mg via SUBCUTANEOUS
  Filled 2015-02-24: qty 1.7

## 2015-02-24 MED ORDER — CALCIUM 600-200 MG-UNIT PO TABS: 1.0000 | ORAL_TABLET | Freq: Two times a day (BID) | ORAL | Status: DC

## 2015-02-24 MED ORDER — DEGARELIX ACETATE 80 MG ~~LOC~~ SOLR
80.0000 mg | Freq: Once | SUBCUTANEOUS | Status: AC
  Administered 2015-02-24: 80 mg via SUBCUTANEOUS
  Filled 2015-02-24: qty 4

## 2015-02-24 NOTE — Progress Notes (Signed)
Patient has no complaints or concerns today. 

## 2015-02-24 NOTE — Progress Notes (Signed)
[place] @ [facility] Telephone:(336) [phone]  Fax:(336) [place] OB: [birth year]  MR#: [id number]  SJG#:[id number]  Patient Care Team: Marguerita Merles, MD as PCP - General (Family Medicine)  CHIEF COMPLAINT:  Chief Complaint  Patient presents with  . 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     Oncology Flowsheet 07/07/2014 12/30/2014 01/27/2015  degarelix (FIRMAGON) [place] - 240 mg 80 mg  denosumab (XGEVA) [place] - - 120 mg  ondansetron (ZOFRAN) IV 4 mg - -    INTERVAL HISTORY:   81 year old gentleman was very poor historian.  Patient was initially seen in [place] by Dr. Baruch Gouty because of clinical diagnoses of prostate cancer and high PSA.  PSA at that time was 91.   Patient refused any prostate biopsy and was lost for follow-up. Patient's condition kept on declining.  Patient denies any pain.  Recently patient had been evaluated by urologist's office and PSA was found to be 753 so a CT scan of bone scan has been ordered which has been reviewed independently sows extensive bone metastases.  Soft tissue mass in the base of the bladder as well as multiple lymph node  Patient still did not have any biopsy done in urologist's office and started androgen deprivation therapy Patient was referred to me for further evaluation and treatment consideration Was accompanied by his son .  February 24, 2015 Patient is here for ongoing evaluation.  Patient did not want any biopsy done.  After starting XGEVA and FIRMAGON . family reports improvement in appetite and bony pains. Calcium is down to 7.8 patient is not taking any calcium  REVIEW OF SYSTEMS:   Gen. status: Poor historian.  Does not complain  too much. HEENT: Hard of hearing.  No headache or dizziness Lungs: Increasing shortness of breath on exertion Cardiac history of atrial fibrillation and flutter Detailed history not known at present time patient is [facility] GI: No nausea no vomiting no diarrhea GU: No dysuria or hematuria Musculoskeletal system no bony pain Skin: No rash Neurological system no headache no dizziness patient has some cognitive difficulties compatible with early dementia without any focal symptoms Lower extremity trace edema Psychiatric system patient is on Celexa for mild depression Endocrine system: Type 2 diabetes and hypertension  As per HPI. Otherwise, a complete review of systems is negatve.  PAST MEDICAL HISTORY: Past Medical History  Diagnosis Date  . Diabetes mellitus without complication ([place])   . Hypertension   . BPH (benign prostatic hyperplasia)   . Hyperlipidemia   . Arthritis   . Anxiety   . Cataract   . History of kidney stones   . OA (osteoarthritis)   . Prostatitis   . Urinary retention   . Elevated PSA   . UTI (lower urinary tract infection)     PAST SURGICAL HISTORY: Past Surgical History  Procedure Laterality Date  . Back surgery    . Hernia repair    . Back surgery    . Cataract extraction      FAMILY HISTORY Family History  Problem Relation Age of Onset  . Throat cancer Father          ADVANCED DIRECTIVES:  Patient does not have any living will or healthcare power of attorney.  Information was given .  Available resources had been discussed.  We will follow-up on subsequent appointments regarding this issue HEALTH MAINTENANCE: Social History  Substance Use Topics  . Smoking status: Former Smoker    Types: Cigarettes  . Smokeless tobacco: Never Used     Comment: quit 30 years ago  . Alcohol Use: No   All medication at been reviewed    OBJECTIVE: PHYSICAL EXAM: Gen. status: Patient is hard of hearing.  Alert and oriented.   Not any acute distress performance status is 2 HEENT: No evidence of stomatitis.  Or jaundice. Lymphatic system: No palpable supraclavicular cervical axillary  Adenopathy. There might be some fullness in the right inguinal area suggesting small palpable lymph node Lungs: Diminished air entry on both sides. Heart: Irregular heart sounds GI: Abdomen is soft no ascites liver and spleen not palpable Examination of the skin revealed no evidence of significant rashes, suspicious appearing nevi or other concerning lesions. Neurological system: No focal signs   Filed Vitals:   02/24/15 1341  BP: 131/67  Pulse: 91  Temp: 97.3 F (36.3 C)     Body mass index is 24.93 kg/(m^2).    ECOG FS:2 - Symptomatic, <50% confined to bed  LAB RESULTS:  Appointment on 02/24/2015  Component Date Value Ref Range Status  . WBC 02/24/2015 9.9  3.8 - 10.6 K/uL Final  . RBC 02/24/2015 4.23* 4.40 - 5.90 MIL/uL Final  . Hemoglobin 02/24/2015 11.8* 13.0 - 18.0 g/dL Final  . HCT 02/24/2015 36.8* 40.0 - 52.0 % Final  . MCV 02/24/2015 87.0  80.0 - 100.0 fL Final  . MCH 02/24/2015 28.0  26.0 - 34.0 pg Final  . MCHC 02/24/2015 32.2  32.0 - 36.0 g/dL Final  . RDW 02/24/2015 16.1* 11.5 - 14.5 % Final  . Platelets 02/24/2015 254  150 - 440 K/uL Final  . Neutrophils Relative % 02/24/2015 43   Final  . Neutro Abs 02/24/2015 4.3  1.4 - 6.5 K/uL Final  . Lymphocytes Relative 02/24/2015 47   Final  . Lymphs Abs 02/24/2015 4.6* 1.0 - 3.6 K/uL Final  . Monocytes Relative 02/24/2015 9   Final  . Monocytes Absolute 02/24/2015 0.9  0.2 - 1.0 K/uL Final  . Eosinophils Relative 02/24/2015 1   Final  . Eosinophils Absolute 02/24/2015 0.1  0 - 0.7 K/uL Final  . Basophils Relative 02/24/2015 0   Final  . Basophils Absolute 02/24/2015 0.0  0 - 0.1 K/uL Final  . Sodium 02/24/2015 137  135 - 145 mmol/L Final  . Potassium 02/24/2015 4.5  3.5 - 5.1 mmol/L Final  . Chloride 02/24/2015 108  101 - 111 mmol/L Final  . CO2 02/24/2015 25   22 - 32 mmol/L Final  . Glucose, Bld 02/24/2015 140* 65 - 99 mg/dL Final  . BUN 02/24/2015 26* 6 - 20 mg/dL Final  . Creatinine, Ser 02/24/2015 0.71  0.61 - 1.24 mg/dL Final  . Calcium 02/24/2015 7.8* 8.9 - 10.3 mg/dL Final  . Total Protein 02/24/2015 7.3  6.5 - 8.1 g/dL Final  . Albumin 02/24/2015 3.6  3.5 - 5.0 g/dL Final  . AST 02/24/2015 34  15 - 41 U/L Final  . ALT 02/24/2015 27  17 - 63 U/L Final  . Alkaline Phosphatase 02/24/2015 635* 38 - 126 U/L Final  . Total Bilirubin 02/24/2015 0.6  0.3 - 1.2 mg/dL Final  . GFR calc non Af [place]nia Lora  02/24/2015 >60  >60 mL/min Final  . GFR calc Af Amer 02/24/2015 >60  >60 mL/min Final   Comment: (NOTE) The eGFR has been calculated using the CKD EPI equation. This calculation has not been validated in all clinical situations. eGFR's persistently <60 mL/min signify possible Chronic Kidney Disease.   . Anion gap 02/24/2015 4* 5 - 15 Final     STUDIES: No results found.  ASSESSMENT:  CT scan and bone scan has been reviewed shows extensive bone metastases.  Soft tissue mass at the base of the bladder PSA 753 which is increased from the PSA which was in May of 2016 was 91 Clinical picture is suggestive of prostate cancer with extensive metastases 2.  PATIENT  does not want any biopsy Calcium is low so will start patient on call crit D1 tablet by mouth twice a day Continue FIRMAGON Reevaluation with PSA at next appointment in 4 weeks     All other lab data has been reviewed.  Alk phosphatase slightly elevated.  In calcium is 7.8.  Patient expressed understanding and was in agreement with this plan. He also understands that He can call clinic at any time with any questions, concerns, or complaints.    Prostate cancer metastatic to bone [facility])   Staging form: Prostate, AJCC 7th Edition     Clinical: Stage IV (T3b, N1, M1, PSA: 20 or greater) - Signed by Forest Gleason, MD on 01/13/2015   Forest Gleason, MD   02/24/2015 1:52 PM

## 2015-03-14 ENCOUNTER — Other Ambulatory Visit: Payer: Self-pay

## 2015-03-14 DIAGNOSIS — N400 Enlarged prostate without lower urinary tract symptoms: Secondary | ICD-10-CM

## 2015-03-14 MED ORDER — FINASTERIDE 5 MG PO TABS: 5.0000 mg | ORAL_TABLET | Freq: Every day | ORAL | Status: DC

## 2015-03-24 ENCOUNTER — Inpatient Hospital Stay: Payer: Medicare HMO | Attending: Oncology | Admitting: Oncology

## 2015-03-24 ENCOUNTER — Inpatient Hospital Stay: Payer: Medicare HMO

## 2015-03-24 ENCOUNTER — Encounter: Payer: Self-pay | Admitting: Oncology

## 2015-03-24 VITALS — BP 116/67 | HR 87 | Temp 96.1°F | Wt 203.1 lb

## 2015-03-24 DIAGNOSIS — Z87442 Personal history of urinary calculi: Secondary | ICD-10-CM | POA: Insufficient documentation

## 2015-03-24 DIAGNOSIS — I10 Essential (primary) hypertension: Secondary | ICD-10-CM | POA: Insufficient documentation

## 2015-03-24 DIAGNOSIS — M1990 Unspecified osteoarthritis, unspecified site: Secondary | ICD-10-CM | POA: Insufficient documentation

## 2015-03-24 DIAGNOSIS — H9190 Unspecified hearing loss, unspecified ear: Secondary | ICD-10-CM

## 2015-03-24 DIAGNOSIS — F419 Anxiety disorder, unspecified: Secondary | ICD-10-CM | POA: Insufficient documentation

## 2015-03-24 DIAGNOSIS — R972 Elevated prostate specific antigen [PSA]: Secondary | ICD-10-CM | POA: Diagnosis not present

## 2015-03-24 DIAGNOSIS — Z808 Family history of malignant neoplasm of other organs or systems: Secondary | ICD-10-CM | POA: Diagnosis not present

## 2015-03-24 DIAGNOSIS — I4891 Unspecified atrial fibrillation: Secondary | ICD-10-CM

## 2015-03-24 DIAGNOSIS — E785 Hyperlipidemia, unspecified: Secondary | ICD-10-CM

## 2015-03-24 DIAGNOSIS — M129 Arthropathy, unspecified: Secondary | ICD-10-CM | POA: Diagnosis not present

## 2015-03-24 DIAGNOSIS — Z87440 Personal history of urinary (tract) infections: Secondary | ICD-10-CM | POA: Insufficient documentation

## 2015-03-24 DIAGNOSIS — C779 Secondary and unspecified malignant neoplasm of lymph node, unspecified: Secondary | ICD-10-CM | POA: Insufficient documentation

## 2015-03-24 DIAGNOSIS — C7951 Secondary malignant neoplasm of bone: Secondary | ICD-10-CM | POA: Diagnosis not present

## 2015-03-24 DIAGNOSIS — Z87891 Personal history of nicotine dependence: Secondary | ICD-10-CM | POA: Insufficient documentation

## 2015-03-24 DIAGNOSIS — R1905 Periumbilic swelling, mass or lump: Secondary | ICD-10-CM | POA: Diagnosis not present

## 2015-03-24 DIAGNOSIS — R339 Retention of urine, unspecified: Secondary | ICD-10-CM | POA: Diagnosis not present

## 2015-03-24 DIAGNOSIS — F329 Major depressive disorder, single episode, unspecified: Secondary | ICD-10-CM | POA: Insufficient documentation

## 2015-03-24 DIAGNOSIS — C61 Malignant neoplasm of prostate: Secondary | ICD-10-CM

## 2015-03-24 DIAGNOSIS — N400 Enlarged prostate without lower urinary tract symptoms: Secondary | ICD-10-CM | POA: Diagnosis not present

## 2015-03-24 DIAGNOSIS — Z5111 Encounter for antineoplastic chemotherapy: Secondary | ICD-10-CM | POA: Diagnosis not present

## 2015-03-24 DIAGNOSIS — E119 Type 2 diabetes mellitus without complications: Secondary | ICD-10-CM | POA: Diagnosis not present

## 2015-03-24 LAB — CBC WITH DIFFERENTIAL/PLATELET
BASOS ABS: 0 10*3/uL (ref 0–0.1)
BASOS PCT: 0 %
EOS PCT: 1 %
Eosinophils Absolute: 0.1 10*3/uL (ref 0–0.7)
HCT: 35.2 % — ABNORMAL LOW (ref 40.0–52.0)
Hemoglobin: 11.5 g/dL — ABNORMAL LOW (ref 13.0–18.0)
Lymphocytes Relative: 37 %
Lymphs Abs: 3.8 10*3/uL — ABNORMAL HIGH (ref 1.0–3.6)
MCH: 27.5 pg (ref 26.0–34.0)
MCHC: 32.5 g/dL (ref 32.0–36.0)
MCV: 84.5 fL (ref 80.0–100.0)
MONO ABS: 1 10*3/uL (ref 0.2–1.0)
Monocytes Relative: 10 %
Neutro Abs: 5.3 10*3/uL (ref 1.4–6.5)
Neutrophils Relative %: 52 %
PLATELETS: 232 10*3/uL (ref 150–440)
RBC: 4.17 MIL/uL — ABNORMAL LOW (ref 4.40–5.90)
RDW: 15.4 % — AB (ref 11.5–14.5)
WBC: 10.2 10*3/uL (ref 3.8–10.6)

## 2015-03-24 LAB — BASIC METABOLIC PANEL
ANION GAP: 6 (ref 5–15)
BUN: 16 mg/dL (ref 6–20)
CALCIUM: 6.8 mg/dL — AB (ref 8.9–10.3)
CO2: 21 mmol/L — ABNORMAL LOW (ref 22–32)
Chloride: 107 mmol/L (ref 101–111)
Creatinine, Ser: 0.62 mg/dL (ref 0.61–1.24)
GFR calc Af Amer: >60 mL/min (ref >60)
GLUCOSE: 107 mg/dL — AB (ref 65–99)
Potassium: 3.7 mmol/L (ref 3.5–5.1)
Sodium: 134 mmol/L — ABNORMAL LOW (ref 135–145)

## 2015-03-24 MED ORDER — DEGARELIX ACETATE 80 MG ~~LOC~~ SOLR
80.0000 mg | Freq: Once | SUBCUTANEOUS | Status: AC
  Administered 2015-03-24: 80 mg via SUBCUTANEOUS
  Filled 2015-03-24: qty 4

## 2015-03-24 NOTE — Progress Notes (Signed)
[place] @ [facility] [phone]) [phone]  Fax:(336) [place] OB: October 03, 1928  MR#: [id number]  MGQ#:[id number]  Patient Care Team: Marguerita Merles, MD as PCP - General (Family Medicine)  CHIEF COMPLAINT:  Chief Complaint  Patient presents with  . 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     Oncology Flowsheet 07/07/2014 12/30/2014 01/27/2015 02/24/2015  degarelix (FIRMAGON) [place] - 240 mg 80 mg 80 mg  denosumab (XGEVA) [place] - - 120 mg 120 mg  ondansetron (ZOFRAN) IV 4 mg - - -    INTERVAL HISTORY:   82 year old gentleman was very poor historian.   Patient is feeling better.  Hemoglobin is improving.  Patient did not take any calcium.  Bony pains have improved.  No difficulty passing urine.  No nausea no vomiting according to his son appetite is improved.  Performance status is improved  He  also complains of diarrhea for last 3 days and Pepto-Bismol helped.  No more diarrhea at present time .   REVIEW OF SYSTEMS:   Gen. status: Poor historian.  Does not complain too much. HEENT: Hard of hearing.  No headache or dizziness Lungs: Increasing shortness of breath on exertion Cardiac history of atrial fibrillation and flutter Detailed history not known at present time patient is [facility] GI: No nausea no vomiting no diarrhea GU: No dysuria or hematuria Musculoskeletal system no bony pain Skin: No rash Neurological system no headache no dizziness patient has some cognitive difficulties compatible with early dementia without any focal symptoms Lower extremity trace edema Psychiatric system patient is on Celexa for mild depression Endocrine system: Type 2 diabetes  and hypertension  As per HPI. Otherwise, a complete review of systems is negatve.  PAST MEDICAL HISTORY: Past Medical History  Diagnosis Date  . Diabetes mellitus without complication ([place])   . Hypertension   . BPH (benign prostatic hyperplasia)   . Hyperlipidemia   . Arthritis   . Anxiety   . Cataract   . History of kidney stones   . OA (osteoarthritis)   . Prostatitis   . Urinary retention   . Elevated PSA   . UTI (lower urinary tract infection)     PAST SURGICAL HISTORY: Past Surgical History  Procedure Laterality Date  . Back surgery    . Hernia repair    . Back surgery    . Cataract extraction      FAMILY HISTORY Family History  Problem Relation Age of Onset  . Throat cancer Father          ADVANCED DIRECTIVES:   Patient does not have any living will or healthcare power of attorney.  Information was given .  Available resources had been discussed.  We will follow-up on subsequent appointments regarding this issue HEALTH MAINTENANCE: Social History  Substance Use Topics  . Smoking status: Former Smoker    Types: Cigarettes  . Smokeless tobacco: Never Used     Comment: quit 30 years ago  . Alcohol Use: No   All medication at been reviewed    OBJECTIVE: PHYSICAL EXAM: Gen. status: Patient is hard of hearing.  Alert and oriented.  Not any acute distress performance status is  2 HEENT: No evidence of stomatitis.  Or jaundice. Lymphatic system: No palpable supraclavicular cervical axillary  Adenopathy. There might be some fullness in the right inguinal area suggesting small palpable lymph node Lungs: Diminished air entry on both sides. Heart: Irregular heart sounds GI: Abdomen is soft no ascites liver and spleen not palpable Examination of the skin revealed no evidence of significant rashes, suspicious appearing nevi or other concerning lesions. Neurological system: No focal signs   Filed Vitals:   03/24/15 1410  BP: 116/67  Pulse: 87  Temp: 96.1  F (35.6 C)     Body mass index is 25.39 kg/(m^2).    ECOG FS:2 - Symptomatic, <50% confined to bed  LAB RESULTS:  Appointment on 03/24/2015  Component Date Value Ref Range Status  . WBC 03/24/2015 10.2  3.8 - 10.6 K/uL Final  . RBC 03/24/2015 4.17* 4.40 - 5.90 MIL/uL Final  . Hemoglobin 03/24/2015 11.5* 13.0 - 18.0 g/dL Final  . HCT 03/24/2015 35.2* 40.0 - 52.0 % Final  . MCV 03/24/2015 84.5  80.0 - 100.0 fL Final  . MCH 03/24/2015 27.5  26.0 - 34.0 pg Final  . MCHC 03/24/2015 32.5  32.0 - 36.0 g/dL Final  . RDW 03/24/2015 15.4* 11.5 - 14.5 % Final  . Platelets 03/24/2015 232  150 - 440 K/uL Final  . Neutrophils Relative % 03/24/2015 52   Final  . Neutro Abs 03/24/2015 5.3  1.4 - 6.5 K/uL Final  . Lymphocytes Relative 03/24/2015 37   Final  . Lymphs Abs 03/24/2015 3.8* 1.0 - 3.6 K/uL Final  . Monocytes Relative 03/24/2015 10   Final  . Monocytes Absolute 03/24/2015 1.0  0.2 - 1.0 K/uL Final  . Eosinophils Relative 03/24/2015 1   Final  . Eosinophils Absolute 03/24/2015 0.1  0 - 0.7 K/uL Final  . Basophils Relative 03/24/2015 0   Final  . Basophils Absolute 03/24/2015 0.0  0 - 0.1 K/uL Final  . Sodium 03/24/2015 134* 135 - 145 mmol/L Final  . Potassium 03/24/2015 3.7  3.5 - 5.1 mmol/L Final  . Chloride 03/24/2015 107  101 - 111 mmol/L Final  . CO2 03/24/2015 21* 22 - 32 mmol/L Final  . Glucose, Bld 03/24/2015 107* 65 - 99 mg/dL Final  . BUN 03/24/2015 16  6 - 20 mg/dL Final  . Creatinine, Ser 03/24/2015 0.62  0.61 - 1.24 mg/dL Final  . Calcium 03/24/2015 6.8* 8.9 - 10.3 mg/dL Final  . GFR calc non Af Amer 03/24/2015 >60  >60 mL/min Final  . GFR calc Af Amer 03/24/2015 >60  >60 mL/min Final   Comment: (NOTE) The eGFR has been calculated using the CKD EPI equation. This calculation has not been validated in all clinical situations. eGFR's persistently <60 mL/min signify possible Chronic Kidney Disease.   . Anion gap 03/24/2015 6  5 - 15 Final     STUDIES: No results  found.  ASSESSMENT:  CT scan and bone scan has been reviewed shows extensive bone metastases.  Soft tissue mass at the base of the bladder Likely patient's condition is improving PSA is pending. We will continue [place].  Hold XGEVA because calcium   Is 5.9  Will go ahead and start patient on oral calcium and vitamin D.  He did not get those pills during last evaluation Son was instructed to buy over-the-counter and start culprit D1 tablet twice a day Reevaluate patient in 4 weeks       Patient expressed understanding and was in agreement with  this plan. He also understands that He can call clinic at any time with any questions, concerns, or complaints.    Prostate cancer metastatic to bone [facility])   Staging form: Prostate, AJCC 7th Edition     Clinical: Stage IV (T3b, N1, M1, PSA: 20 or greater) - Signed by Forest Gleason, MD on 01/13/2015   Forest Gleason, MD   03/24/2015 2:39 PM

## 2015-03-24 NOTE — Progress Notes (Signed)
Patient states he has had diarrhea for the past 3 days.  Has been taking imodium without complete resolve.

## 2015-03-25 LAB — PSA: PSA: 14.99 ng/mL — ABNORMAL HIGH (ref 0.00–4.00)

## 2015-04-21 ENCOUNTER — Ambulatory Visit: Payer: Medicare HMO | Admitting: Oncology

## 2015-04-21 ENCOUNTER — Ambulatory Visit: Payer: Medicare HMO

## 2015-04-21 ENCOUNTER — Other Ambulatory Visit: Payer: Medicare HMO

## 2015-04-25 ENCOUNTER — Inpatient Hospital Stay: Payer: Medicare HMO

## 2015-04-25 ENCOUNTER — Inpatient Hospital Stay: Payer: Medicare HMO | Attending: Oncology | Admitting: Oncology

## 2015-04-25 VITALS — BP 121/78 | HR 85 | Temp 96.9°F | Resp 18 | Wt 201.2 lb

## 2015-04-25 DIAGNOSIS — Z87442 Personal history of urinary calculi: Secondary | ICD-10-CM | POA: Diagnosis not present

## 2015-04-25 DIAGNOSIS — R339 Retention of urine, unspecified: Secondary | ICD-10-CM | POA: Insufficient documentation

## 2015-04-25 DIAGNOSIS — M1990 Unspecified osteoarthritis, unspecified site: Secondary | ICD-10-CM | POA: Insufficient documentation

## 2015-04-25 DIAGNOSIS — N400 Enlarged prostate without lower urinary tract symptoms: Secondary | ICD-10-CM

## 2015-04-25 DIAGNOSIS — C7951 Secondary malignant neoplasm of bone: Secondary | ICD-10-CM | POA: Diagnosis not present

## 2015-04-25 DIAGNOSIS — R972 Elevated prostate specific antigen [PSA]: Secondary | ICD-10-CM | POA: Insufficient documentation

## 2015-04-25 DIAGNOSIS — C779 Secondary and unspecified malignant neoplasm of lymph node, unspecified: Secondary | ICD-10-CM | POA: Diagnosis not present

## 2015-04-25 DIAGNOSIS — C61 Malignant neoplasm of prostate: Secondary | ICD-10-CM | POA: Diagnosis not present

## 2015-04-25 DIAGNOSIS — Z7984 Long term (current) use of oral hypoglycemic drugs: Secondary | ICD-10-CM | POA: Diagnosis not present

## 2015-04-25 DIAGNOSIS — M129 Arthropathy, unspecified: Secondary | ICD-10-CM | POA: Diagnosis not present

## 2015-04-25 DIAGNOSIS — I10 Essential (primary) hypertension: Secondary | ICD-10-CM | POA: Diagnosis not present

## 2015-04-25 DIAGNOSIS — Z87891 Personal history of nicotine dependence: Secondary | ICD-10-CM | POA: Diagnosis not present

## 2015-04-25 DIAGNOSIS — E785 Hyperlipidemia, unspecified: Secondary | ICD-10-CM

## 2015-04-25 DIAGNOSIS — Z5111 Encounter for antineoplastic chemotherapy: Secondary | ICD-10-CM | POA: Insufficient documentation

## 2015-04-25 DIAGNOSIS — Z87440 Personal history of urinary (tract) infections: Secondary | ICD-10-CM | POA: Diagnosis not present

## 2015-04-25 DIAGNOSIS — Z79899 Other long term (current) drug therapy: Secondary | ICD-10-CM | POA: Insufficient documentation

## 2015-04-25 DIAGNOSIS — E119 Type 2 diabetes mellitus without complications: Secondary | ICD-10-CM | POA: Insufficient documentation

## 2015-04-25 DIAGNOSIS — Z808 Family history of malignant neoplasm of other organs or systems: Secondary | ICD-10-CM | POA: Insufficient documentation

## 2015-04-25 LAB — COMPREHENSIVE METABOLIC PANEL
ALT: 39 U/L (ref 17–63)
ANION GAP: 5 (ref 5–15)
AST: 39 U/L (ref 15–41)
Albumin: 3.8 g/dL (ref 3.5–5.0)
Alkaline Phosphatase: 225 U/L — ABNORMAL HIGH (ref 38–126)
BUN: 25 mg/dL — ABNORMAL HIGH (ref 6–20)
CALCIUM: 8.1 mg/dL — AB (ref 8.9–10.3)
CHLORIDE: 109 mmol/L (ref 101–111)
CO2: 23 mmol/L (ref 22–32)
Creatinine, Ser: 0.83 mg/dL (ref 0.61–1.24)
GFR calc non Af Amer: >60 mL/min (ref >60)
Glucose, Bld: 143 mg/dL — ABNORMAL HIGH (ref 65–99)
POTASSIUM: 4.5 mmol/L (ref 3.5–5.1)
SODIUM: 137 mmol/L (ref 135–145)
Total Bilirubin: 0.6 mg/dL (ref 0.3–1.2)
Total Protein: 7.6 g/dL (ref 6.5–8.1)

## 2015-04-25 LAB — CBC WITH DIFFERENTIAL/PLATELET
BASOS PCT: 1 %
Basophils Absolute: 0 10*3/uL (ref 0–0.1)
EOS ABS: 0.2 10*3/uL (ref 0–0.7)
EOS PCT: 2 %
HCT: 37.9 % — ABNORMAL LOW (ref 40.0–52.0)
Hemoglobin: 12.4 g/dL — ABNORMAL LOW (ref 13.0–18.0)
LYMPHS ABS: 3.7 10*3/uL — AB (ref 1.0–3.6)
Lymphocytes Relative: 39 %
MCH: 27.3 pg (ref 26.0–34.0)
MCHC: 32.6 g/dL (ref 32.0–36.0)
MCV: 83.7 fL (ref 80.0–100.0)
MONOS PCT: 9 %
Monocytes Absolute: 0.9 10*3/uL (ref 0.2–1.0)
Neutro Abs: 4.7 10*3/uL (ref 1.4–6.5)
Neutrophils Relative %: 49 %
PLATELETS: 210 10*3/uL (ref 150–440)
RBC: 4.53 MIL/uL (ref 4.40–5.90)
RDW: 15.5 % — ABNORMAL HIGH (ref 11.5–14.5)
WBC: 9.6 10*3/uL (ref 3.8–10.6)

## 2015-04-25 LAB — MAGNESIUM: Magnesium: 2.1 mg/dL (ref 1.7–2.4)

## 2015-04-25 MED ORDER — DEGARELIX ACETATE 80 MG ~~LOC~~ SOLR
80.0000 mg | Freq: Once | SUBCUTANEOUS | Status: AC
  Administered 2015-04-25: 80 mg via SUBCUTANEOUS
  Filled 2015-04-25: qty 4

## 2015-04-25 MED ORDER — DENOSUMAB 120 MG/1.7ML ~~LOC~~ SOLN
120.0000 mg | Freq: Once | SUBCUTANEOUS | Status: AC
  Administered 2015-04-25: 120 mg via SUBCUTANEOUS
  Filled 2015-04-25: qty 1.7

## 2015-04-25 NOTE — Progress Notes (Signed)
[place]  [phone]  Fax:(336) [place] DOB: 07-16-1928  MR#: [id number]  QIH#:[id number]  Patient Care Team: Marguerita Merles, MD as PCP - General (Family Medicine)  CHIEF COMPLAINT:  Chief Complaint  Patient presents with  . Prostate Cancer    INTERVAL HISTORY:  Patient is here for further evaluation and treatment consideration regarding prostate cancer. Patient has a stage IV prostate cancer with metastasis to bone and lymph node. PSA at one time was as high as 753. He is in wheelchair today but due to long distance of walking he gets fatigued. He denies any other acute complaints. He reports continuing to self catheterize and notes no hematuria, cloudiness, foul-smelling or discolored urine. He does report having a good appetite and continues with calcium and vitamin D supplements twice daily.  REVIEW OF SYSTEMS:   Review of Systems  Constitutional: Negative for fever, chills, weight loss, malaise/fatigue and diaphoresis.  HENT: Negative.   Eyes: Negative.   Respiratory: Negative for cough, hemoptysis, sputum production, shortness of breath and wheezing.   Cardiovascular: Negative for chest pain, palpitations, orthopnea, claudication, leg swelling and PND.  Gastrointestinal: Negative for heartburn, nausea, vomiting, abdominal pain, diarrhea, constipation, blood in stool and melena.  Genitourinary: Negative.        Self Catheter  Musculoskeletal: Negative.   Skin: Negative.   Neurological: Negative for dizziness, tingling, focal weakness, seizures and weakness.  Endo/Heme/Allergies: Does not bruise/bleed easily.  Psychiatric/Behavioral: Negative for depression. The patient is not nervous/anxious and does not have insomnia.     As per HPI. Otherwise, a complete review of systems is negatve.  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 Diabetes mellitus without complication ([place])   . Hypertension   . BPH (benign prostatic hyperplasia)   . Hyperlipidemia   . Arthritis   . Anxiety   . Cataract   . History of kidney stones   . OA (osteoarthritis)   . Prostatitis   . Urinary retention   . Elevated PSA   . UTI (lower urinary tract infection)     PAST SURGICAL HISTORY: Past Surgical History  Procedure Laterality Date  . Back surgery    . Hernia repair    . Back surgery    . Cataract extraction      FAMILY HISTORY Family History  Problem Relation Age of Onset  . Throat cancer Father     GYNECOLOGIC HISTORY:  No LMP for male patient.     ADVANCED DIRECTIVES:    HEALTH MAINTENANCE: Social History  Substance Use Topics  . Smoking status: Former Smoker    Types: Cigarettes  . Smokeless tobacco: Never Used     Comment: quit 30 years ago  . Alcohol Use: No     Colonoscopy:  PAP:  Bone density:  Mammogram:  Not on File  Current Outpatient Prescriptions  Medication Sig Dispense Refill  . atorvastatin (LIPITOR) 10 MG tablet Take 10 mg by mouth daily.    . Calcium 600-200 MG-UNIT tablet Take 1 tablet by mouth  2 (two) times daily. 60 tablet 3  . citalopram (CELEXA) 10 MG tablet Take 10 mg by mouth daily.    . finasteride (PROSCAR) 5 MG tablet Take 1 tablet (5 mg total) by mouth daily. 30 tablet 3  . glipiZIDE (GLUCOTROL) 5 MG tablet Take 5 mg by mouth daily before breakfast.    . hydrochlorothiazide (MICROZIDE) 12.5 MG capsule     . metFORMIN (GLUMETZA) 500 MG (MOD) 24 hr tablet Take 500 mg by mouth daily with breakfast.    . ondansetron (ZOFRAN) 4 MG tablet Take 1 tablet (4 mg  total) by mouth daily as needed for nausea or vomiting. 30 tablet 1  . quinapril (ACCUPRIL) 20 MG tablet Take 20 mg by mouth at bedtime.    . tamsulosin (FLOMAX) 0.4 MG CAPS capsule Take 0.4 mg by mouth daily after supper.     No current facility-administered medications for this visit.    OBJECTIVE: BP 121/78 mmHg  Pulse 85  Temp(Src) 96.9 F (36.1 C) (Tympanic)  Resp 18  Wt 201 lb 3 oz (91.258 kg)   Body mass index is 25.15 kg/(m^2).    ECOG FS:1 - Symptomatic but completely ambulatory  General: Well-developed, well-nourished, no acute distress. Eyes: Pink conjunctiva, anicteric sclera. HEENT: Normocephalic, moist mucous membranes, clear oropharnyx. Lungs: Diminished at bases bilaterally.[place] Heart: Regular rate and rhythm. No rubs, murmurs, or gallops. Abdomen: Soft, nontender, nondistended. No organomegaly noted, normoactive bowel sounds. Musculoskeletal: No edema, cyanosis, or clubbing. Neuro: Alert, answering all questions appropriately. Cranial nerves grossly intact. Skin: No rashes or petechiae noted. Psych: Normal affect. Lymphatics: No cervical, clavicular, or axillary LAD.   LAB RESULTS:  Appointment on 04/25/2015  Component Date Value Ref Range Status  . WBC 04/25/2015 9.6  3.8 - 10.6 K/uL Final  . RBC 04/25/2015 4.53  4.40 - 5.90 MIL/uL Final  . Hemoglobin 04/25/2015 12.4* 13.0 - 18.0 g/dL Final  . HCT 04/25/2015 37.9* 40.0 - 52.0 % Final  . MCV 04/25/2015 83.7  80.0 - 100.0 fL Final  . MCH 04/25/2015 27.3  26.0 - 34.0 pg Final  . MCHC 04/25/2015 32.6  32.0 - 36.0 g/dL Final  . RDW 04/25/2015 15.5* 11.5 - 14.5 % Final  . Platelets 04/25/2015 210  150 - 440 K/uL Final  . Neutrophils Relative % 04/25/2015 49   Final  . Neutro Abs 04/25/2015 4.7  1.4 - 6.5 K/uL Final  . Lymphocytes Relative 04/25/2015 39   Final  . Lymphs Abs 04/25/2015 3.7* 1.0 - 3.6 K/uL Final  . Monocytes Relative 04/25/2015 9   Final  . Monocytes Absolute 04/25/2015 0.9  0.2 - 1.0 K/uL Final    . Eosinophils Relative 04/25/2015 2   Final  . Eosinophils Absolute 04/25/2015 0.2  0 - 0.7 K/uL Final  . Basophils Relative 04/25/2015 1   Final  . Basophils Absolute 04/25/2015 0.0  0 - 0.1 K/uL Final  . Sodium 04/25/2015 137  135 - 145 mmol/L Final  . Potassium 04/25/2015 4.5  3.5 - 5.1 mmol/L Final  . Chloride 04/25/2015 109  101 - 111 mmol/L Final  . CO2 04/25/2015 23  22 - 32 mmol/L Final  . Glucose, Bld 04/25/2015 143* 65 - 99 mg/dL Final  . BUN 04/25/2015 25* 6 - 20 mg/dL Final  . Creatinine, Ser 04/25/2015 0.83  0.61 - 1.24 mg/dL Final  . Calcium 04/25/2015 8.1* 8.9 - 10.3 mg/dL Final  . Total Protein 04/25/2015 7.6  6.5 - 8.1 g/dL Final  . Albumin 04/25/2015  3.8  3.5 - 5.0 g/dL Final  . AST 04/25/2015 39  15 - 41 U/L Final  . ALT 04/25/2015 39  17 - 63 U/L Final  . Alkaline Phosphatase 04/25/2015 225* 38 - 126 U/L Final  . Total Bilirubin 04/25/2015 0.6  0.3 - 1.2 mg/dL Final  . GFR calc non Af Amer 04/25/2015 >60  >60 mL/min Final  . GFR calc Af Amer 04/25/2015 >60  >60 mL/min Final   Comment: (NOTE) The eGFR has been calculated using the CKD EPI equation. This calculation has not been validated in all clinical situations. eGFR's persistently <60 mL/min signify possible Chronic Kidney Disease.   . Anion gap 04/25/2015 5  5 - 15 Final  . Magnesium 04/25/2015 2.1  1.7 - 2.4 mg/dL Final    STUDIES: No results found.  ASSESSMENT:  Carcinoma of prostate with extensive bone metastasis, stage IV, T3 N1 M1.  PLAN:  1. Prostate cancer. Patient had a CT scan and bone scan that revealed extensive bone metastasis as well as soft tissue mass at the base of the bladder. Patient is overall feeling very well and denies any pain. Patient's PSA continues to decrease, 14.9 in January, at highest was 753. Calcium level today is 8.1, he is currently taking calcium with vitamin D twice daily. We'll proceed with [place].   We'll continue with routine follow-up in approximately  4 weeks for continuation of therapy.  Patient expressed understanding and was in agreement with this plan. He also understands that He can call clinic at any time with any questions, concerns, or complaints.   Dr. Oliva Bustard was available for consultation and review of plan of care for this patient.  Prostate cancer metastatic to bone [facility])   Staging form: Prostate, AJCC 7th Edition     Clinical: Stage IV (T3b, N1, M1, PSA: 20 or greater) - Signed by Forest Gleason, MD on 01/13/2015   Evlyn Kanner, NP   04/25/2015 2:26 PM

## 2015-04-26 LAB — PSA: PSA: 20.43 ng/mL — ABNORMAL HIGH (ref 0.00–4.00)

## 2015-05-23 ENCOUNTER — Ambulatory Visit: Payer: Medicare HMO

## 2015-05-23 ENCOUNTER — Ambulatory Visit: Payer: Medicare HMO | Admitting: Family Medicine

## 2015-05-23 ENCOUNTER — Other Ambulatory Visit: Payer: Medicare HMO

## 2015-05-26 ENCOUNTER — Inpatient Hospital Stay: Payer: Medicare HMO | Attending: Internal Medicine | Admitting: Internal Medicine

## 2015-05-26 ENCOUNTER — Ambulatory Visit: Payer: Medicare HMO | Admitting: Oncology

## 2015-05-26 ENCOUNTER — Ambulatory Visit: Payer: Medicare HMO

## 2015-05-26 ENCOUNTER — Inpatient Hospital Stay: Payer: Medicare HMO

## 2015-05-26 ENCOUNTER — Other Ambulatory Visit: Payer: Medicare HMO

## 2015-05-26 ENCOUNTER — Encounter: Payer: Self-pay | Admitting: Internal Medicine

## 2015-05-26 VITALS — BP 151/89 | HR 85 | Temp 97.0°F | Resp 22 | Ht 75.0 in | Wt 203.3 lb

## 2015-05-26 DIAGNOSIS — I10 Essential (primary) hypertension: Secondary | ICD-10-CM | POA: Insufficient documentation

## 2015-05-26 DIAGNOSIS — E785 Hyperlipidemia, unspecified: Secondary | ICD-10-CM | POA: Diagnosis not present

## 2015-05-26 DIAGNOSIS — C61 Malignant neoplasm of prostate: Secondary | ICD-10-CM

## 2015-05-26 DIAGNOSIS — E119 Type 2 diabetes mellitus without complications: Secondary | ICD-10-CM | POA: Insufficient documentation

## 2015-05-26 DIAGNOSIS — Z87891 Personal history of nicotine dependence: Secondary | ICD-10-CM | POA: Diagnosis not present

## 2015-05-26 DIAGNOSIS — C7951 Secondary malignant neoplasm of bone: Secondary | ICD-10-CM | POA: Diagnosis not present

## 2015-05-26 DIAGNOSIS — Z79899 Other long term (current) drug therapy: Secondary | ICD-10-CM | POA: Diagnosis not present

## 2015-05-26 DIAGNOSIS — Z79818 Long term (current) use of other agents affecting estrogen receptors and estrogen levels: Secondary | ICD-10-CM | POA: Insufficient documentation

## 2015-05-26 DIAGNOSIS — M1990 Unspecified osteoarthritis, unspecified site: Secondary | ICD-10-CM | POA: Diagnosis not present

## 2015-05-26 DIAGNOSIS — Z87442 Personal history of urinary calculi: Secondary | ICD-10-CM | POA: Insufficient documentation

## 2015-05-26 DIAGNOSIS — Z7984 Long term (current) use of oral hypoglycemic drugs: Secondary | ICD-10-CM | POA: Insufficient documentation

## 2015-05-26 DIAGNOSIS — F419 Anxiety disorder, unspecified: Secondary | ICD-10-CM | POA: Insufficient documentation

## 2015-05-26 DIAGNOSIS — N400 Enlarged prostate without lower urinary tract symptoms: Secondary | ICD-10-CM | POA: Insufficient documentation

## 2015-05-26 LAB — CBC WITH DIFFERENTIAL/PLATELET
BASOS ABS: 0 10*3/uL (ref 0–0.1)
BASOS PCT: 1 %
EOS PCT: 1 %
Eosinophils Absolute: 0.1 10*3/uL (ref 0–0.7)
HCT: 35.8 % — ABNORMAL LOW (ref 40.0–52.0)
HEMOGLOBIN: 11.9 g/dL — AB (ref 13.0–18.0)
Lymphocytes Relative: 38 %
Lymphs Abs: 3.5 10*3/uL (ref 1.0–3.6)
MCH: 27.3 pg (ref 26.0–34.0)
MCHC: 33.3 g/dL (ref 32.0–36.0)
MCV: 82.1 fL (ref 80.0–100.0)
MONO ABS: 0.8 10*3/uL (ref 0.2–1.0)
Monocytes Relative: 9 %
NEUTROS ABS: 4.7 10*3/uL (ref 1.4–6.5)
Neutrophils Relative %: 51 %
Platelets: 230 10*3/uL (ref 150–440)
RBC: 4.36 MIL/uL — AB (ref 4.40–5.90)
RDW: 15.6 % — ABNORMAL HIGH (ref 11.5–14.5)
WBC: 9.2 10*3/uL (ref 3.8–10.6)

## 2015-05-26 LAB — COMPREHENSIVE METABOLIC PANEL
ALK PHOS: 187 U/L — AB (ref 38–126)
ALT: 38 U/L (ref 17–63)
ANION GAP: 7 (ref 5–15)
AST: 43 U/L — ABNORMAL HIGH (ref 15–41)
Albumin: 3.7 g/dL (ref 3.5–5.0)
BILIRUBIN TOTAL: 0.7 mg/dL (ref 0.3–1.2)
BUN: 22 mg/dL — ABNORMAL HIGH (ref 6–20)
CALCIUM: 8.1 mg/dL — AB (ref 8.9–10.3)
CO2: 23 mmol/L (ref 22–32)
Chloride: 105 mmol/L (ref 101–111)
Creatinine, Ser: 0.81 mg/dL (ref 0.61–1.24)
GFR calc non Af Amer: >60 mL/min (ref >60)
Glucose, Bld: 125 mg/dL — ABNORMAL HIGH (ref 65–99)
Potassium: 4.4 mmol/L (ref 3.5–5.1)
SODIUM: 135 mmol/L (ref 135–145)
TOTAL PROTEIN: 7.5 g/dL (ref 6.5–8.1)

## 2015-05-26 LAB — MAGNESIUM: Magnesium: 2.1 mg/dL (ref 1.7–2.4)

## 2015-05-26 MED ORDER — DEGARELIX ACETATE 80 MG ~~LOC~~ SOLR
80.0000 mg | Freq: Once | SUBCUTANEOUS | Status: AC
  Administered 2015-05-26: 80 mg via SUBCUTANEOUS
  Filled 2015-05-26: qty 4

## 2015-05-26 NOTE — Progress Notes (Signed)
[facility] OFFICE PROGRESS NOTE  Patient Care Team: Marguerita Merles, MD as PCP - General (Family Medicine)   SUMMARY OF ONCOLOGIC HISTORY:  # SEP 2016- METASTATIC PROSTATE CANCER to bone; PSA -750; Biopsy declined. On Firmagon q4 W+ X-geva.   INTERVAL HISTORY:  82 year old male patient with above history of metastatic prostate cancer to the bone currently on Firmagon on a monthly basis is here for follow-up. Patient's appetite is good. Denies any weight loss. He denies any pain. He walks with a walker at home. Denies any falls. Denies any hot flashes.  Patient has mild swelling in his legs which is not any worse. This is chronic.   REVIEW OF SYSTEMS:  A complete 10 point review of system is done which is negative except mentioned above/history of present illness.   PAST MEDICAL HISTORY :  Past Medical History  Diagnosis Date  . Diabetes mellitus without complication ([place])   . Hypertension   . BPH (benign prostatic hyperplasia)   . Hyperlipidemia   . Arthritis   . Anxiety   . Cataract   . History of kidney stones   . OA (osteoarthritis)   . Prostatitis   . Urinary retention   . Elevated PSA   . UTI (lower urinary tract infection)   . Hearing loss     PAST SURGICAL HISTORY :   Past Surgical History  Procedure Laterality Date  . Back surgery    . Hernia repair    . Back surgery    . Cataract extraction      FAMILY HISTORY :   Family History  Problem Relation Age of Onset  . Throat cancer Father     SOCIAL HISTORY:   Social History  Substance Use Topics  . Smoking status: Former Smoker    Types: Cigarettes  . Smokeless tobacco: Never Used     Comment: quit 30 years ago  . Alcohol Use: No    ALLERGIES:  has No Known Allergies.  MEDICATIONS:  Current Outpatient Prescriptions  Medication Sig Dispense Refill  . atorvastatin (LIPITOR) 10 MG tablet Take 10 mg by mouth daily.    . Calcium 600-200 MG-UNIT tablet Take 1 tablet by mouth 2 (two)  times daily. 60 tablet 3  . finasteride (PROSCAR) 5 MG tablet Take 1 tablet (5 mg total) by mouth daily. 30 tablet 3  . glipiZIDE (GLUCOTROL) 5 MG tablet Take 5 mg by mouth daily before breakfast.    . hydrochlorothiazide (MICROZIDE) 12.5 MG capsule Take 12.5 mg by mouth daily.     . metFORMIN (GLUMETZA) 500 MG (MOD) 24 hr tablet Take 500 mg by mouth daily with breakfast.    . citalopram (CELEXA) 10 MG tablet Take 10 mg by mouth daily.    . ondansetron (ZOFRAN) 4 MG tablet Take 1 tablet (4 mg total) by mouth daily as needed for nausea or vomiting. (Patient not taking: Reported on 05/26/2015) 30 tablet 1  . quinapril (ACCUPRIL) 20 MG tablet Take 20 mg by mouth at bedtime.    . tamsulosin (FLOMAX) 0.4 MG CAPS capsule Take 0.4 mg by mouth daily after supper.     No current facility-administered medications for this visit.    PHYSICAL EXAMINATION: ECOG PERFORMANCE STATUS: 3 - Symptomatic, >50% confined to bed  BP 151/89 mmHg  Pulse 85  Temp(Src) 97 F (36.1 C) (Tympanic)  Resp 22  Ht 6\' 3"  (1.905 m)  Wt 203 lb 4.2 oz (92.2 kg)  BMI 25.41 kg/m2  Filed  Weights   05/26/15 1404  Weight: 203 lb 4.2 oz (92.2 kg)    GENERAL: Well-nourished well-developed; Alert, no distress and comfortable.  With his son; in wheel chair; [place].  EYES: no pallor or icterus OROPHARYNX: no thrush or ulceration; poor dentition.  NECK: supple, no masses felt LYMPH:  no palpable lymphadenopathy in the cervical, axillary or inguinal regions LUNGS: clear to auscultation and  No wheeze or crackles HEART/CVS: regular rate & rhythm and no murmurs; 1 plus LE extremity edema ABDOMEN:abdomen soft, non-tender and normal bowel sounds Musculoskeletal:no cyanosis of digits and no clubbing  PSYCH: alert & oriented x 3 with fluent speech NEURO: no focal motor/sensory deficits SKIN:  no rashes or significant lesions  LABORATORY DATA:  I have reviewed the data as listed    Component Value Date/Time   NA 135 05/26/2015  1316   K 4.4 05/26/2015 1316   CL 105 05/26/2015 1316   CO2 23 05/26/2015 1316   GLUCOSE 125* 05/26/2015 1316   BUN 22* 05/26/2015 1316   CREATININE 0.81 05/26/2015 1316   CALCIUM 8.1* 05/26/2015 1316   PROT 7.5 05/26/2015 1316   ALBUMIN 3.7 05/26/2015 1316   AST 43* 05/26/2015 1316   ALT 38 05/26/2015 1316   ALKPHOS 187* 05/26/2015 1316   BILITOT 0.7 05/26/2015 1316   GFRNONAA >60 05/26/2015 1316   GFRAA >60 05/26/2015 1316    No results found for: SPEP, UPEP  Lab Results  Component Value Date   WBC 9.2 05/26/2015   NEUTROABS 4.7 05/26/2015   HGB 11.9* 05/26/2015   HCT 35.8* 05/26/2015   MCV 82.1 05/26/2015   PLT 230 05/26/2015      Chemistry      Component Value Date/Time   NA 135 05/26/2015 1316   K 4.4 05/26/2015 1316   CL 105 05/26/2015 1316   CO2 23 05/26/2015 1316   BUN 22* 05/26/2015 1316   CREATININE 0.81 05/26/2015 1316      Component Value Date/Time   CALCIUM 8.1* 05/26/2015 1316   ALKPHOS 187* 05/26/2015 1316   AST 43* 05/26/2015 1316   ALT 38 05/26/2015 1316   BILITOT 0.7 05/26/2015 1316       ASSESSMENT & PLAN:   #  METASTATIC PROSTATE CANCER To the bone. Patient on Lupron every 3 months. Patient's PSA has gone down from 750 in September 2016 to 14.99 in January 2017. However, February 17th-  PSA was 20. It is unclear at this time if patient is getting into the castrate resistant phase. I recommend checking PSA/testosterone in one month.  If the PSA continues to rise in face in the context of castrate levels of testosterone- additional therapies like Xtandi have to be considered.   # Bony metastases- currently on X-geva. Hold Xgeva today given his calcium 8.1. Recommend continue calcium plus vitamin D twice a day; add vitamin D thousand units once a day.  # Patient follow-up with Dr. Oliva Bustard in 4 weeks CBC CMP and PSA testosterone; Firmagon/Xgeva.      Cammie Sickle, MD 05/26/2015 2:37 PM

## 2015-05-27 LAB — PSA: PSA: 53.63 ng/mL — ABNORMAL HIGH (ref 0.00–4.00)

## 2015-06-24 ENCOUNTER — Inpatient Hospital Stay: Payer: Medicare HMO

## 2015-06-24 ENCOUNTER — Inpatient Hospital Stay: Payer: Medicare HMO | Attending: Oncology

## 2015-06-24 ENCOUNTER — Inpatient Hospital Stay (HOSPITAL_BASED_OUTPATIENT_CLINIC_OR_DEPARTMENT_OTHER): Payer: Medicare HMO | Admitting: Family Medicine

## 2015-06-24 VITALS — BP 154/88 | HR 86 | Temp 97.7°F | Wt 208.3 lb

## 2015-06-24 DIAGNOSIS — C779 Secondary and unspecified malignant neoplasm of lymph node, unspecified: Secondary | ICD-10-CM | POA: Diagnosis not present

## 2015-06-24 DIAGNOSIS — I10 Essential (primary) hypertension: Secondary | ICD-10-CM | POA: Diagnosis not present

## 2015-06-24 DIAGNOSIS — E119 Type 2 diabetes mellitus without complications: Secondary | ICD-10-CM

## 2015-06-24 DIAGNOSIS — R339 Retention of urine, unspecified: Secondary | ICD-10-CM

## 2015-06-24 DIAGNOSIS — C61 Malignant neoplasm of prostate: Secondary | ICD-10-CM

## 2015-06-24 DIAGNOSIS — Z808 Family history of malignant neoplasm of other organs or systems: Secondary | ICD-10-CM

## 2015-06-24 DIAGNOSIS — M129 Arthropathy, unspecified: Secondary | ICD-10-CM | POA: Insufficient documentation

## 2015-06-24 DIAGNOSIS — F419 Anxiety disorder, unspecified: Secondary | ICD-10-CM | POA: Insufficient documentation

## 2015-06-24 DIAGNOSIS — Z7984 Long term (current) use of oral hypoglycemic drugs: Secondary | ICD-10-CM

## 2015-06-24 DIAGNOSIS — Z79899 Other long term (current) drug therapy: Secondary | ICD-10-CM | POA: Insufficient documentation

## 2015-06-24 DIAGNOSIS — Z79818 Long term (current) use of other agents affecting estrogen receptors and estrogen levels: Secondary | ICD-10-CM | POA: Insufficient documentation

## 2015-06-24 DIAGNOSIS — Z87442 Personal history of urinary calculi: Secondary | ICD-10-CM | POA: Diagnosis not present

## 2015-06-24 DIAGNOSIS — C7951 Secondary malignant neoplasm of bone: Secondary | ICD-10-CM | POA: Diagnosis not present

## 2015-06-24 DIAGNOSIS — M1990 Unspecified osteoarthritis, unspecified site: Secondary | ICD-10-CM | POA: Diagnosis not present

## 2015-06-24 DIAGNOSIS — Z87891 Personal history of nicotine dependence: Secondary | ICD-10-CM | POA: Diagnosis not present

## 2015-06-24 DIAGNOSIS — N390 Urinary tract infection, site not specified: Secondary | ICD-10-CM | POA: Diagnosis not present

## 2015-06-24 LAB — COMPREHENSIVE METABOLIC PANEL
ALK PHOS: 231 U/L — AB (ref 38–126)
ALT: 34 U/L (ref 17–63)
AST: 40 U/L (ref 15–41)
Albumin: 3.7 g/dL (ref 3.5–5.0)
Anion gap: 5 (ref 5–15)
BUN: 27 mg/dL — AB (ref 6–20)
CALCIUM: 8.7 mg/dL — AB (ref 8.9–10.3)
CO2: 25 mmol/L (ref 22–32)
CREATININE: 0.9 mg/dL (ref 0.61–1.24)
Chloride: 109 mmol/L (ref 101–111)
GFR calc Af Amer: >60 mL/min (ref >60)
Glucose, Bld: 155 mg/dL — ABNORMAL HIGH (ref 65–99)
Potassium: 4.3 mmol/L (ref 3.5–5.1)
Sodium: 139 mmol/L (ref 135–145)
TOTAL PROTEIN: 7.5 g/dL (ref 6.5–8.1)
Total Bilirubin: 0.8 mg/dL (ref 0.3–1.2)

## 2015-06-24 LAB — PSA: PSA: 180 ng/mL — AB (ref 0.00–4.00)

## 2015-06-24 LAB — CBC WITH DIFFERENTIAL/PLATELET
BASOS ABS: 0 10*3/uL (ref 0–0.1)
Basophils Relative: 1 %
Eosinophils Absolute: 0.1 10*3/uL (ref 0–0.7)
Eosinophils Relative: 2 %
HEMATOCRIT: 35.9 % — AB (ref 40.0–52.0)
HEMOGLOBIN: 11.8 g/dL — AB (ref 13.0–18.0)
LYMPHS ABS: 3 10*3/uL (ref 1.0–3.6)
LYMPHS PCT: 38 %
MCH: 27.5 pg (ref 26.0–34.0)
MCHC: 32.9 g/dL (ref 32.0–36.0)
MCV: 83.5 fL (ref 80.0–100.0)
MONO ABS: 0.7 10*3/uL (ref 0.2–1.0)
Monocytes Relative: 9 %
NEUTROS PCT: 50 %
Neutro Abs: 4 10*3/uL (ref 1.4–6.5)
Platelets: 230 10*3/uL (ref 150–440)
RBC: 4.3 MIL/uL — AB (ref 4.40–5.90)
RDW: 16.8 % — AB (ref 11.5–14.5)
WBC: 7.8 10*3/uL (ref 3.8–10.6)

## 2015-06-24 MED ORDER — DEGARELIX ACETATE 80 MG ~~LOC~~ SOLR
80.0000 mg | Freq: Once | SUBCUTANEOUS | Status: AC
  Administered 2015-06-24: 80 mg via SUBCUTANEOUS
  Filled 2015-06-24: qty 4

## 2015-06-24 MED ORDER — DENOSUMAB 120 MG/1.7ML ~~LOC~~ SOLN: 120.0000 mg | Freq: Once | SUBCUTANEOUS | Status: DC

## 2015-06-24 MED ORDER — DENOSUMAB 120 MG/1.7ML ~~LOC~~ SOLN
120.0000 mg | Freq: Once | SUBCUTANEOUS | Status: AC
  Administered 2015-06-24: 120 mg via SUBCUTANEOUS
  Filled 2015-06-24: qty 1.7

## 2015-06-24 NOTE — Progress Notes (Signed)
[place]  Telephone:(336) [phone]  Fax:(336) [place] DOB: [birth year]  MR#: [id number]  MEB#:[id number]  Patient Care Team: Marguerita Merles, MD as PCP - General (Family Medicine)  CHIEF COMPLAINT:  Chief Complaint  Patient presents with  . Prostate cancer metastatic to bone    INTERVAL HISTORY:  Patient is here for further evaluation and treatment consideration regarding prostate cancer. Patient has a stage IV prostate cancer with metastasis to bone and lymph node. PSA at one time was as high as 753. He is in wheelchair today but due to long distance of walking he gets fatigued. He does use a walker for ambulation at home. His PSA has slightly increased over the past several months, PSA from today is pending. He denies any other acute complaints. He reports continuing to self catheterize and notes no hematuria, cloudiness, foul-smelling or discolored urine. He does report having a good appetite and continues with calcium and vitamin D supplements twice daily. He overall reports feeling great.  REVIEW OF SYSTEMS:   Review of Systems  Constitutional: Negative for fever, chills, weight loss, malaise/fatigue and diaphoresis.  HENT: Negative.   Eyes: Negative.   Respiratory: Negative for cough, hemoptysis, sputum production, shortness of breath and wheezing.   Cardiovascular: Negative for chest pain, palpitations, orthopnea, claudication, leg swelling and PND.  Gastrointestinal: Negative for heartburn, nausea, vomiting, abdominal pain, diarrhea, constipation, blood in stool and melena.  Genitourinary: Negative.        Self Catheter  Musculoskeletal: Negative.   Skin: Negative.   Neurological: Negative for dizziness, tingling, focal weakness, seizures and weakness.  Endo/Heme/Allergies: Does not bruise/bleed easily.  Psychiatric/Behavioral: Negative for depression. The patient is not nervous/anxious and does not have insomnia.     As per HPI. Otherwise,  a complete review of systems is negatve.  ONCOLOGY HISTORY: Oncology History   Clinical diagnosis of carcinoma of prostate metastases to bone and lymph node High PSA of 753 No biopsy has been done Clinically stage is T3 N2 M1 disease stage IV Started on androgen deprivation therapy from October 24 by urologist     Prostate cancer metastatic to bone [facility])   12/31/2014 Initial Diagnosis Prostate cancer metastatic to bone [facility])    PAST MEDICAL HISTORY: Past Medical History  Diagnosis Date  . Diabetes mellitus without complication ([place])   . Hypertension   . BPH (benign prostatic hyperplasia)   . Hyperlipidemia   . Arthritis   . Anxiety   . Cataract   . History of kidney stones   . OA (osteoarthritis)   . Prostatitis   . Urinary retention   . Elevated PSA   . UTI (lower urinary tract infection)   . Hearing loss     PAST SURGICAL HISTORY: Past Surgical History  Procedure Laterality Date  . Back surgery    . Hernia repair    . Back surgery    . Cataract extraction      FAMILY HISTORY Family History  Problem Relation Age of Onset  . Throat cancer Father     GYNECOLOGIC HISTORY:  No LMP for male patient.     ADVANCED DIRECTIVES:    HEALTH MAINTENANCE: Social History  Substance Use Topics  . Smoking status: Former Smoker    Types: Cigarettes  . Smokeless tobacco: Never Used     Comment: quit 30 years ago  . Alcohol Use: No      No Known Allergies  Current Outpatient Prescriptions  Medication  Sig Dispense Refill  . atorvastatin (LIPITOR) 10 MG tablet Take 10 mg by mouth daily.    . Calcium 600-200 MG-UNIT tablet Take 1 tablet by mouth 2 (two) times daily. 60 tablet 3  . finasteride (PROSCAR) 5 MG tablet Take 1 tablet (5 mg total) by mouth daily. 30 tablet 3  . glipiZIDE (GLUCOTROL) 5 MG tablet Take 5 mg by mouth daily before breakfast.    . hydrochlorothiazide (MICROZIDE) 12.5 MG capsule Take 12.5 mg by mouth daily.     . metFORMIN (GLUMETZA) 500 MG  (MOD) 24 hr tablet Take 500 mg by mouth daily with breakfast.    . quinapril (ACCUPRIL) 20 MG tablet Take 20 mg by mouth at bedtime.    . tamsulosin (FLOMAX) 0.4 MG CAPS capsule Take 0.4 mg by mouth daily after supper.     No current facility-administered medications for this visit.    OBJECTIVE: BP 154/88 mmHg  Pulse 86  Temp(Src) 97.7 F (36.5 C) (Oral)  Wt 208 lb 5.4 oz (94.5 kg)  SpO2 98%   Body mass index is 26.04 kg/(m^2).    ECOG FS:1 - Symptomatic but completely ambulatory  General: Well-developed, well-nourished, no acute distress. Eyes: Pink conjunctiva, anicteric sclera. HEENT: Normocephalic, moist mucous membranes, clear oropharnyx. Lungs: Diminished at bases bilaterally.Marland Kitchen Heart: Regular rate and rhythm. No rubs, murmurs, or gallops. Abdomen: Soft, nontender, nondistended. No organomegaly noted, normoactive bowel sounds. Musculoskeletal: No edema, cyanosis, or clubbing. Neuro: Alert, answering all questions appropriately. Cranial nerves grossly intact. Skin: No rashes or petechiae noted. Psych: Normal affect. Lymphatics: No cervical, clavicular  LAD.   LAB RESULTS:  Appointment on 06/24/2015  Component Date Value Ref Range Status  . WBC 06/24/2015 7.8  3.8 - 10.6 K/uL Final  . RBC 06/24/2015 4.30* 4.40 - 5.90 MIL/uL Final  . Hemoglobin 06/24/2015 11.8* 13.0 - 18.0 g/dL Final  . HCT 06/24/2015 35.9* 40.0 - 52.0 % Final  . MCV 06/24/2015 83.5  80.0 - 100.0 fL Final  . MCH 06/24/2015 27.5  26.0 - 34.0 pg Final  . MCHC 06/24/2015 32.9  32.0 - 36.0 g/dL Final  . RDW 06/24/2015 16.8* 11.5 - 14.5 % Final  . Platelets 06/24/2015 230  150 - 440 K/uL Final  . Neutrophils Relative % 06/24/2015 50   Final  . Neutro Abs 06/24/2015 4.0  1.4 - 6.5 K/uL Final  . Lymphocytes Relative 06/24/2015 38   Final  . Lymphs Abs 06/24/2015 3.0  1.0 - 3.6 K/uL Final  . Monocytes Relative 06/24/2015 9   Final  . Monocytes Absolute 06/24/2015 0.7  0.2 - 1.0 K/uL Final  . Eosinophils  Relative 06/24/2015 2   Final  . Eosinophils Absolute 06/24/2015 0.1  0 - 0.7 K/uL Final  . Basophils Relative 06/24/2015 1   Final  . Basophils Absolute 06/24/2015 0.0  0 - 0.1 K/uL Final  . Sodium 06/24/2015 139  135 - 145 mmol/L Final  . Potassium 06/24/2015 4.3  3.5 - 5.1 mmol/L Final  . Chloride 06/24/2015 109  101 - 111 mmol/L Final  . CO2 06/24/2015 25  22 - 32 mmol/L Final  . Glucose, Bld 06/24/2015 155* 65 - 99 mg/dL Final  . BUN 06/24/2015 27* 6 - 20 mg/dL Final  . Creatinine, Ser 06/24/2015 0.90  0.61 - 1.24 mg/dL Final  . Calcium 06/24/2015 8.7* 8.9 - 10.3 mg/dL Final  . Total Protein 06/24/2015 7.5  6.5 - 8.1 g/dL Final  . Albumin 06/24/2015 3.7  3.5 - 5.0 g/dL Final  .  AST 06/24/2015 40  15 - 41 U/L Final  . ALT 06/24/2015 34  17 - 63 U/L Final  . Alkaline Phosphatase 06/24/2015 231* 38 - 126 U/L Final  . Total Bilirubin 06/24/2015 0.8  0.3 - 1.2 mg/dL Final  . GFR calc non Af Amer 06/24/2015 >60  >60 mL/min Final  . GFR calc Af Amer 06/24/2015 >60  >60 mL/min Final   Comment: (NOTE) The eGFR has been calculated using the CKD EPI equation. This calculation has not been validated in all clinical situations. eGFR's persistently <60 mL/min signify possible Chronic Kidney Disease.   . Anion gap 06/24/2015 5  5 - 15 Final    STUDIES: No results found.  ASSESSMENT:  Carcinoma of prostate with extensive bone metastasis, stage IV, T3 N1 M1.  PLAN:  1. Prostate cancer. Patient had a CT scan and bone scan that revealed extensive bone metastasis as well as soft tissue mass at the base of the bladder. Patient is overall feeling very well and denies any pain. Patient's PSA has steadily increased since January, in March 59 and at highest on diagnosis was 753. Calcium level today is 8. 7, he is currently taking calcium with vitamin D twice daily. We'll proceed with [place].   We'll continue with routine follow-up in approximately 4 weeks for continuation of  therapy.  Patient expressed understanding and was in agreement with this plan. He also understands that He can call clinic at any time with any questions, concerns, or complaints.   Dr. Oliva Bustard was available for consultation and review of plan of care for this patient.  Prostate cancer metastatic to bone [facility])   Staging form: Prostate, AJCC 7th Edition     Clinical: Stage IV (T3b, N1, M1, PSA: 20 or greater) - Signed by Forest Gleason, MD on 01/13/2015   Evlyn Kanner, NP   06/24/2015 11:37 AM

## 2015-06-25 LAB — TESTOSTERONE: Testosterone: 7 ng/dL — ABNORMAL LOW (ref 348–1197)

## 2015-07-22 ENCOUNTER — Other Ambulatory Visit: Payer: Self-pay | Admitting: *Deleted

## 2015-07-22 DIAGNOSIS — C61 Malignant neoplasm of prostate: Secondary | ICD-10-CM

## 2015-07-22 DIAGNOSIS — C7951 Secondary malignant neoplasm of bone: Principal | ICD-10-CM

## 2015-07-24 ENCOUNTER — Inpatient Hospital Stay: Payer: Medicare HMO | Attending: Oncology

## 2015-07-24 ENCOUNTER — Inpatient Hospital Stay (HOSPITAL_BASED_OUTPATIENT_CLINIC_OR_DEPARTMENT_OTHER): Payer: Medicare HMO | Admitting: Oncology

## 2015-07-24 ENCOUNTER — Inpatient Hospital Stay: Payer: Medicare HMO

## 2015-07-24 ENCOUNTER — Encounter: Payer: Self-pay | Admitting: Oncology

## 2015-07-24 VITALS — BP 109/67 | HR 88 | Temp 97.3°F | Resp 18 | Wt 197.1 lb

## 2015-07-24 DIAGNOSIS — E785 Hyperlipidemia, unspecified: Secondary | ICD-10-CM | POA: Diagnosis not present

## 2015-07-24 DIAGNOSIS — E119 Type 2 diabetes mellitus without complications: Secondary | ICD-10-CM | POA: Insufficient documentation

## 2015-07-24 DIAGNOSIS — I10 Essential (primary) hypertension: Secondary | ICD-10-CM

## 2015-07-24 DIAGNOSIS — M129 Arthropathy, unspecified: Secondary | ICD-10-CM | POA: Insufficient documentation

## 2015-07-24 DIAGNOSIS — M1990 Unspecified osteoarthritis, unspecified site: Secondary | ICD-10-CM | POA: Diagnosis not present

## 2015-07-24 DIAGNOSIS — Z87442 Personal history of urinary calculi: Secondary | ICD-10-CM | POA: Insufficient documentation

## 2015-07-24 DIAGNOSIS — Z808 Family history of malignant neoplasm of other organs or systems: Secondary | ICD-10-CM

## 2015-07-24 DIAGNOSIS — C7951 Secondary malignant neoplasm of bone: Secondary | ICD-10-CM

## 2015-07-24 DIAGNOSIS — C61 Malignant neoplasm of prostate: Secondary | ICD-10-CM | POA: Diagnosis not present

## 2015-07-24 DIAGNOSIS — M545 Low back pain: Secondary | ICD-10-CM

## 2015-07-24 DIAGNOSIS — Z79899 Other long term (current) drug therapy: Secondary | ICD-10-CM | POA: Insufficient documentation

## 2015-07-24 DIAGNOSIS — N400 Enlarged prostate without lower urinary tract symptoms: Secondary | ICD-10-CM | POA: Diagnosis not present

## 2015-07-24 DIAGNOSIS — C779 Secondary and unspecified malignant neoplasm of lymph node, unspecified: Secondary | ICD-10-CM | POA: Diagnosis not present

## 2015-07-24 DIAGNOSIS — Z79818 Long term (current) use of other agents affecting estrogen receptors and estrogen levels: Secondary | ICD-10-CM

## 2015-07-24 DIAGNOSIS — R9721 Rising PSA following treatment for malignant neoplasm of prostate: Secondary | ICD-10-CM | POA: Diagnosis not present

## 2015-07-24 DIAGNOSIS — F419 Anxiety disorder, unspecified: Secondary | ICD-10-CM | POA: Diagnosis not present

## 2015-07-24 DIAGNOSIS — Z87891 Personal history of nicotine dependence: Secondary | ICD-10-CM

## 2015-07-24 DIAGNOSIS — I4891 Unspecified atrial fibrillation: Secondary | ICD-10-CM | POA: Insufficient documentation

## 2015-07-24 LAB — CBC WITH DIFFERENTIAL/PLATELET
BASOS ABS: 0 10*3/uL (ref 0–0.1)
Basophils Relative: 1 %
EOS ABS: 0.1 10*3/uL (ref 0–0.7)
Eosinophils Relative: 1 %
HCT: 36.9 % — ABNORMAL LOW (ref 40.0–52.0)
HEMOGLOBIN: 12.2 g/dL — AB (ref 13.0–18.0)
LYMPHS ABS: 3.9 10*3/uL — AB (ref 1.0–3.6)
LYMPHS PCT: 40 %
MCH: 28.1 pg (ref 26.0–34.0)
MCHC: 33.1 g/dL (ref 32.0–36.0)
MCV: 84.9 fL (ref 80.0–100.0)
Monocytes Absolute: 1 10*3/uL (ref 0.2–1.0)
Monocytes Relative: 10 %
NEUTROS PCT: 48 %
Neutro Abs: 4.7 10*3/uL (ref 1.4–6.5)
Platelets: 277 10*3/uL (ref 150–440)
RBC: 4.35 MIL/uL — AB (ref 4.40–5.90)
RDW: 16.4 % — ABNORMAL HIGH (ref 11.5–14.5)
WBC: 9.7 10*3/uL (ref 3.8–10.6)

## 2015-07-24 LAB — COMPREHENSIVE METABOLIC PANEL
ALK PHOS: 461 U/L — AB (ref 38–126)
ALT: 25 U/L (ref 17–63)
AST: 34 U/L (ref 15–41)
Albumin: 3.8 g/dL (ref 3.5–5.0)
Anion gap: 5 (ref 5–15)
BUN: 29 mg/dL — AB (ref 6–20)
CALCIUM: 8.7 mg/dL — AB (ref 8.9–10.3)
CO2: 25 mmol/L (ref 22–32)
CREATININE: 0.97 mg/dL (ref 0.61–1.24)
Chloride: 104 mmol/L (ref 101–111)
GFR calc non Af Amer: >60 mL/min (ref >60)
GLUCOSE: 197 mg/dL — AB (ref 65–99)
Potassium: 4.3 mmol/L (ref 3.5–5.1)
SODIUM: 134 mmol/L — AB (ref 135–145)
Total Bilirubin: 0.8 mg/dL (ref 0.3–1.2)
Total Protein: 7.9 g/dL (ref 6.5–8.1)

## 2015-07-24 LAB — PSA: PSA: 442 ng/mL — ABNORMAL HIGH (ref 0.00–4.00)

## 2015-07-24 MED ORDER — HYDROCODONE-ACETAMINOPHEN 5-325 MG PO TABS: ORAL_TABLET | ORAL | Status: DC

## 2015-07-24 NOTE — Progress Notes (Signed)
[place] @ [facility] Telephone:(336) [phone]  Fax:(336) [place] OB: 05/04/1928  MR#: [id number]  KZS#:[id number]  Patient Care Team: Marguerita Merles, MD as PCP - General (Family Medicine)  CHIEF COMPLAINT:  Chief Complaint  Patient presents with  . Prostate Cancer   Clinical diagnosis of carcinoma of prostate metastases to bone and lymph node High PSA of 753 No biopsy has been done Clinically stage is T3 N2 M1 disease stage IV Started on androgen deprivation therapy from October 24 by urologist VISIT DIAGNOSIS:   No diagnosis found.     Oncology Flowsheet 12/30/2014 01/27/2015 02/24/2015 03/24/2015 04/25/2015 05/26/2015 06/24/2015  degarelix (FIRMAGON) [place] 240 mg 80 mg 80 mg 80 mg 80 mg 80 mg 80 mg  denosumab (XGEVA) [place] - 120 mg 120 mg - 120 mg - 120 mg  ondansetron (ZOFRAN) IV - - - - - - -    INTERVAL HISTORY:   82 year old gentleman was very poor historian.   Patient is feeling better.  Hemoglobin is improving.  Patient did not take any calcium.  Bony pains have improved.  No difficulty passing urine.  No nausea no vomiting according to his son appetite is improved.  Performance status is improved  Patient has again rising PSA.  Having some pain in the low back area patient is very poor historian says he is not feeling too well.  Son also complains of high deductible experience associated with present treatment ($1200 per visit) Here for further follow-up in wheelchair.   REVIEW OF SYSTEMS:   Gen. status: Poor historian.  Feeling weak and tired. HEENT: Hard of hearing.  No headache or dizziness Lungs: Increasing shortness of breath on exertion Cardiac history of atrial fibrillation and flutter Detailed history not known at present time patient is [facility] GI: No nausea no vomiting no diarrhea GU: No dysuria or hematuria Musculoskeletal system no bony pain increasing back pain. Skin: No rash Neurological system no  headache no dizziness patient has some cognitive difficulties compatible with early dementia without any focal symptoms Lower extremity trace edema Psychiatric system patient is on Celexa for mild depression Endocrine system: Type 2 diabetes and hypertension  As per HPI. Otherwise, a complete review of systems is negatve.  PAST MEDICAL HISTORY: Past Medical History  Diagnosis Date  . Diabetes mellitus without complication ([place])   . Hypertension   . BPH (benign prostatic hyperplasia)   . Hyperlipidemia   . Arthritis   . Anxiety   . Cataract   . History of kidney stones   . OA (osteoarthritis)   . Prostatitis   . Urinary retention   . Elevated PSA   . UTI (lower urinary tract infection)   . Hearing loss     PAST SURGICAL HISTORY: Past Surgical History  Procedure Laterality Date  . Back surgery    . Hernia repair    . Back surgery    . Cataract extraction      FAMILY HISTORY Family History  Problem Relation Age of Onset  . Throat cancer Father          ADVANCED DIRECTIVES:   Patient does not have any living will or healthcare power of attorney.  Information was given .  Available resources had been discussed.  We will follow-up on subsequent appointments regarding this issue HEALTH MAINTENANCE: Social History  Substance Use Topics  . Smoking status: Former Smoker    Types: Cigarettes  . Smokeless tobacco: Never  Used     Comment: quit 30 years ago  . Alcohol Use: No   All medication at been reviewed    OBJECTIVE: PHYSICAL EXAM: Gen. status: Patient is hard of hearing.  Alert and oriented.  Not any acute distress performance status is 2 HEENT: No evidence of stomatitis.  Or jaundice. Lymphatic system: No palpable supraclavicular cervical axillary  Adenopathy. There might be some fullness in the right inguinal area suggesting small palpable lymph node Lungs: Diminished air entry on both sides. Heart: Irregular heart sounds GI: Abdomen is soft no ascites  liver and spleen not palpable Examination of the skin revealed no evidence of significant rashes, suspicious appearing nevi or other concerning lesions. Neurological system: No focal signs   Filed Vitals:   07/24/15 1400  BP: 109/67  Pulse: 88  Temp: 97.3 F (36.3 C)  Resp: 18     Body mass index is 24.64 kg/(m^2).    ECOG FS:2 - Symptomatic, <50% confined to bed  LAB RESULTS:  Appointment on 07/24/2015  Component Date Value Ref Range Status  . WBC 07/24/2015 9.7  3.8 - 10.6 K/uL Final  . RBC 07/24/2015 4.35* 4.40 - 5.90 MIL/uL Final  . Hemoglobin 07/24/2015 12.2* 13.0 - 18.0 g/dL Final  . HCT 07/24/2015 36.9* 40.0 - 52.0 % Final  . MCV 07/24/2015 84.9  80.0 - 100.0 fL Final  . MCH 07/24/2015 28.1  26.0 - 34.0 pg Final  . MCHC 07/24/2015 33.1  32.0 - 36.0 g/dL Final  . RDW 07/24/2015 16.4* 11.5 - 14.5 % Final  . Platelets 07/24/2015 277  150 - 440 K/uL Final  . Neutrophils Relative % 07/24/2015 48   Final  . Neutro Abs 07/24/2015 4.7  1.4 - 6.5 K/uL Final  . Lymphocytes Relative 07/24/2015 40   Final  . Lymphs Abs 07/24/2015 3.9* 1.0 - 3.6 K/uL Final  . Monocytes Relative 07/24/2015 10   Final  . Monocytes Absolute 07/24/2015 1.0  0.2 - 1.0 K/uL Final  . Eosinophils Relative 07/24/2015 1   Final  . Eosinophils Absolute 07/24/2015 0.1  0 - 0.7 K/uL Final  . Basophils Relative 07/24/2015 1   Final  . Basophils Absolute 07/24/2015 0.0  0 - 0.1 K/uL Final  . Sodium 07/24/2015 134* 135 - 145 mmol/L Final  . Potassium 07/24/2015 4.3  3.5 - 5.1 mmol/L Final  . Chloride 07/24/2015 104  101 - 111 mmol/L Final  . CO2 07/24/2015 25  22 - 32 mmol/L Final  . Glucose, Bld 07/24/2015 197* 65 - 99 mg/dL Final  . BUN 07/24/2015 29* 6 - 20 mg/dL Final  . Creatinine, Ser 07/24/2015 0.97  0.61 - 1.24 mg/dL Final  . Calcium 07/24/2015 8.7* 8.9 - 10.3 mg/dL Final  . Total Protein 07/24/2015 7.9  6.5 - 8.1 g/dL Final  . Albumin 07/24/2015 3.8  3.5 - 5.0 g/dL Final  . AST 07/24/2015 34  15 -  41 U/L Final  . ALT 07/24/2015 25  17 - 63 U/L Final  . Alkaline Phosphatase 07/24/2015 461* 38 - 126 U/L Final  . Total Bilirubin 07/24/2015 0.8  0.3 - 1.2 mg/dL Final  . GFR calc non Af Amer 07/24/2015 >60  >60 mL/min Final  . GFR calc Af Amer 07/24/2015 >60  >60 mL/min Final   Comment: (NOTE) The eGFR has been calculated using the CKD EPI equation. This calculation has not been validated in all clinical situations. eGFR's persistently <60 mL/min signify possible Chronic Kidney Disease.   Georgiann Hahn  gap 07/24/2015 5  5 - 15 Final    61moago (06/24/15) 157mogo (05/26/15) 28m87moo (04/25/15)     PSA 0.00 - 4.00 ng/mL 180.00 (H) 53.63 (H)CM 20.43 (H)CM         STUDIES: No results found.  ASSESSMENT:  CT scan and bone scan has been reviewed shows extensive bone metastases.  The patient has a rising PSA Clinically   patient has increasing back pain. I had prolonged discussion with patient.  Possibility of continuing ADT and XGEVA.  Calcium is within acceptable range. Possibility of changing to Lupron to cut the co-pay. Also if PSA continues to go up either radiation therapy versus starting XTANDI.  Again patient's concern about increasing cost needs to be considered.  I will be in touch with him after looking at the PSA.  And we will see whether XTAGillermina Phyn be covered without significant co-pay.. 2. Regarding   pain patient was given 1/2   To 1 tab of vicodin  And instructed   Regarding constipation.   Total duration of visit was  30   minutes.  50% or more time was spent in counseling patient and family regarding prognosis and options of treatment and available resources         Patient expressed understanding and was in agreement with this plan. He also understands that He can call clinic at any time with any questions, concerns, or complaints.    [facility] Staging form: Prostate, AJCC 7th Edition     Clinical: Stage IV (T3b, N1, M1, PSA: 20  or greater) - Signed by JanForest GleasonD on 01/13/2015   JanForest GleasonD   07/24/2015 2:26 PM

## 2015-07-24 NOTE — Progress Notes (Signed)
Patient states he thinks he is getting a cold.  Generally does not feel good.  States he is weak.  Also c/o bilateral rib pain 3/10.

## 2015-07-25 ENCOUNTER — Other Ambulatory Visit: Payer: Self-pay | Admitting: Family Medicine

## 2015-07-29 ENCOUNTER — Inpatient Hospital Stay: Payer: Medicare HMO

## 2015-07-29 DIAGNOSIS — C61 Malignant neoplasm of prostate: Secondary | ICD-10-CM | POA: Diagnosis not present

## 2015-07-29 DIAGNOSIS — C7951 Secondary malignant neoplasm of bone: Principal | ICD-10-CM

## 2015-07-29 MED ORDER — LEUPROLIDE ACETATE (3 MONTH) 22.5 MG IM KIT
22.5000 mg | PACK | Freq: Once | INTRAMUSCULAR | Status: AC
  Administered 2015-07-29: 22.5 mg via INTRAMUSCULAR
  Filled 2015-07-29: qty 22.5

## 2015-07-29 MED ORDER — DENOSUMAB 120 MG/1.7ML ~~LOC~~ SOLN
120.0000 mg | Freq: Once | SUBCUTANEOUS | Status: AC
  Administered 2015-07-29: 120 mg via SUBCUTANEOUS
  Filled 2015-07-29: qty 1.7

## 2015-08-01 ENCOUNTER — Other Ambulatory Visit: Payer: Self-pay | Admitting: *Deleted

## 2015-08-01 DIAGNOSIS — C61 Malignant neoplasm of prostate: Secondary | ICD-10-CM

## 2015-08-01 DIAGNOSIS — C7951 Secondary malignant neoplasm of bone: Principal | ICD-10-CM

## 2015-08-01 MED ORDER — ENZALUTAMIDE 40 MG PO CAPS: 160.0000 mg | ORAL_CAPSULE | Freq: Every day | ORAL | Status: DC

## 2015-08-22 ENCOUNTER — Other Ambulatory Visit: Payer: Self-pay | Admitting: *Deleted

## 2015-08-22 ENCOUNTER — Other Ambulatory Visit: Payer: Self-pay | Admitting: Internal Medicine

## 2015-08-22 DIAGNOSIS — C7951 Secondary malignant neoplasm of bone: Principal | ICD-10-CM

## 2015-08-22 DIAGNOSIS — C61 Malignant neoplasm of prostate: Secondary | ICD-10-CM

## 2015-08-25 ENCOUNTER — Inpatient Hospital Stay: Payer: Medicare HMO

## 2015-08-25 ENCOUNTER — Inpatient Hospital Stay: Payer: Medicare HMO | Attending: Internal Medicine | Admitting: Internal Medicine

## 2015-08-25 VITALS — BP 161/81 | HR 71 | Temp 97.1°F | Resp 18 | Wt 197.6 lb

## 2015-08-25 DIAGNOSIS — Z87442 Personal history of urinary calculi: Secondary | ICD-10-CM | POA: Insufficient documentation

## 2015-08-25 DIAGNOSIS — R630 Anorexia: Secondary | ICD-10-CM | POA: Diagnosis not present

## 2015-08-25 DIAGNOSIS — E871 Hypo-osmolality and hyponatremia: Secondary | ICD-10-CM | POA: Insufficient documentation

## 2015-08-25 DIAGNOSIS — W19XXXA Unspecified fall, initial encounter: Secondary | ICD-10-CM | POA: Insufficient documentation

## 2015-08-25 DIAGNOSIS — C7951 Secondary malignant neoplasm of bone: Principal | ICD-10-CM

## 2015-08-25 DIAGNOSIS — M1990 Unspecified osteoarthritis, unspecified site: Secondary | ICD-10-CM | POA: Insufficient documentation

## 2015-08-25 DIAGNOSIS — E119 Type 2 diabetes mellitus without complications: Secondary | ICD-10-CM | POA: Diagnosis not present

## 2015-08-25 DIAGNOSIS — E785 Hyperlipidemia, unspecified: Secondary | ICD-10-CM | POA: Diagnosis not present

## 2015-08-25 DIAGNOSIS — Z87891 Personal history of nicotine dependence: Secondary | ICD-10-CM | POA: Insufficient documentation

## 2015-08-25 DIAGNOSIS — C61 Malignant neoplasm of prostate: Secondary | ICD-10-CM

## 2015-08-25 DIAGNOSIS — M545 Low back pain, unspecified: Secondary | ICD-10-CM | POA: Insufficient documentation

## 2015-08-25 DIAGNOSIS — Z79899 Other long term (current) drug therapy: Secondary | ICD-10-CM | POA: Diagnosis not present

## 2015-08-25 DIAGNOSIS — Z87440 Personal history of urinary (tract) infections: Secondary | ICD-10-CM | POA: Insufficient documentation

## 2015-08-25 DIAGNOSIS — F419 Anxiety disorder, unspecified: Secondary | ICD-10-CM | POA: Diagnosis not present

## 2015-08-25 DIAGNOSIS — I10 Essential (primary) hypertension: Secondary | ICD-10-CM | POA: Diagnosis not present

## 2015-08-25 DIAGNOSIS — R296 Repeated falls: Secondary | ICD-10-CM | POA: Diagnosis not present

## 2015-08-25 DIAGNOSIS — R9721 Rising PSA following treatment for malignant neoplasm of prostate: Secondary | ICD-10-CM

## 2015-08-25 DIAGNOSIS — R339 Retention of urine, unspecified: Secondary | ICD-10-CM | POA: Diagnosis not present

## 2015-08-25 DIAGNOSIS — Z599 Problem related to housing and economic circumstances, unspecified: Secondary | ICD-10-CM | POA: Insufficient documentation

## 2015-08-25 DIAGNOSIS — M129 Arthropathy, unspecified: Secondary | ICD-10-CM | POA: Insufficient documentation

## 2015-08-25 DIAGNOSIS — N400 Enlarged prostate without lower urinary tract symptoms: Secondary | ICD-10-CM | POA: Diagnosis not present

## 2015-08-25 DIAGNOSIS — Z808 Family history of malignant neoplasm of other organs or systems: Secondary | ICD-10-CM | POA: Insufficient documentation

## 2015-08-25 DIAGNOSIS — Z598 Other problems related to housing and economic circumstances: Secondary | ICD-10-CM | POA: Insufficient documentation

## 2015-08-25 DIAGNOSIS — Z7984 Long term (current) use of oral hypoglycemic drugs: Secondary | ICD-10-CM | POA: Diagnosis not present

## 2015-08-25 DIAGNOSIS — W19XXXD Unspecified fall, subsequent encounter: Secondary | ICD-10-CM

## 2015-08-25 LAB — CBC WITH DIFFERENTIAL/PLATELET
Basophils Absolute: 0 10*3/uL (ref 0–0.1)
Basophils Relative: 0 %
Eosinophils Absolute: 0.1 10*3/uL (ref 0–0.7)
Eosinophils Relative: 0 %
HEMATOCRIT: 35.4 % — AB (ref 40.0–52.0)
HEMOGLOBIN: 12.1 g/dL — AB (ref 13.0–18.0)
LYMPHS ABS: 2.5 10*3/uL (ref 1.0–3.6)
LYMPHS PCT: 22 %
MCH: 28.7 pg (ref 26.0–34.0)
MCHC: 34.2 g/dL (ref 32.0–36.0)
MCV: 83.8 fL (ref 80.0–100.0)
MONO ABS: 0.7 10*3/uL (ref 0.2–1.0)
MONOS PCT: 6 %
Neutro Abs: 8.2 10*3/uL — ABNORMAL HIGH (ref 1.4–6.5)
Neutrophils Relative %: 72 %
Platelets: 316 10*3/uL (ref 150–440)
RBC: 4.22 MIL/uL — ABNORMAL LOW (ref 4.40–5.90)
RDW: 15.3 % — AB (ref 11.5–14.5)
WBC: 11.5 10*3/uL — AB (ref 3.8–10.6)

## 2015-08-25 LAB — COMPREHENSIVE METABOLIC PANEL
ALBUMIN: 3.6 g/dL (ref 3.5–5.0)
ALK PHOS: 510 U/L — AB (ref 38–126)
ALT: 24 U/L (ref 17–63)
ANION GAP: 10 (ref 5–15)
AST: 43 U/L — ABNORMAL HIGH (ref 15–41)
BILIRUBIN TOTAL: 0.7 mg/dL (ref 0.3–1.2)
BUN: 25 mg/dL — ABNORMAL HIGH (ref 6–20)
CALCIUM: 8.6 mg/dL — AB (ref 8.9–10.3)
CO2: 22 mmol/L (ref 22–32)
Chloride: 96 mmol/L — ABNORMAL LOW (ref 101–111)
Creatinine, Ser: 0.91 mg/dL (ref 0.61–1.24)
GLUCOSE: 285 mg/dL — AB (ref 65–99)
POTASSIUM: 4.6 mmol/L (ref 3.5–5.1)
Sodium: 128 mmol/L — ABNORMAL LOW (ref 135–145)
TOTAL PROTEIN: 7.9 g/dL (ref 6.5–8.1)

## 2015-08-25 LAB — PSA: PSA: 1024 ng/mL — AB (ref 0.00–4.00)

## 2015-08-25 MED ORDER — DENOSUMAB 120 MG/1.7ML ~~LOC~~ SOLN
120.0000 mg | Freq: Once | SUBCUTANEOUS | Status: AC
  Administered 2015-08-25: 120 mg via SUBCUTANEOUS
  Filled 2015-08-25: qty 1.7

## 2015-08-25 NOTE — Assessment & Plan Note (Signed)
Sodium 128 recommend increased by mouth fluids especially with salt like chicken broth.;

## 2015-08-25 NOTE — Progress Notes (Signed)
Pt voices concerns with financial barriers to care.  "unable to afford my medications for my prostate cancer." Social worker consult placed while patient in office.  Pt also states the he will need financial assistance with xtandi.  [employer] application completed while patient in clinic. Will fax to [employer] at [phone]

## 2015-08-25 NOTE — Assessment & Plan Note (Signed)
General debility/progressive malignancy-recommend caution. No concerns for seizures. Okay with X-tandi.

## 2015-08-25 NOTE — Progress Notes (Signed)
[place] OFFICE PROGRESS NOTE  Patient Care Team: Marguerita Merles, MD as PCP - General (Family Medicine)  Prostate cancer metastatic to bone [facility])   Staging form: Prostate, AJCC 7th Edition     Clinical: Stage IV (T3b, N1, M1, PSA: 20 or greater) - Signed by Forest Gleason, MD on 01/13/2015    Oncology History   SEP 2016- METASTATIC PROSTATE CA to Bone/LN [PSA- [id number]; OCT 2016- ADT  # JAN-Feb 2017- CASTRATE RESISTANT PROSTATE CA; Metta Clines; Lupron q3M [facility] 23rd 2017]; PSA- rising ~400 [May 2017]       Prostate cancer metastatic to bone ([place])   12/31/2014 Initial Diagnosis Prostate cancer metastatic to bone [facility])     INTERVAL HISTORY:  Albert Fritz 82 y.o.  male pleasant patient above history of Metastatic prostate cancer the bone/lymph nodes. Patient has been on androgen deprivation therapy since October 2016. However his PSA has been rising more recently.   Patient has been unable to start [facility]- given co-pay issues.  Overall he feels poorly. Poor appetite. Possible weight loss. He recent mechanical fall. He did not hurt himself to bed.   No nausea no vomiting. No chest pain or shortness of breath. His chronic back pain for which she takes hydrocodone 1-2 pills a day.  REVIEW OF SYSTEMS:  A complete 10 point review of system is done which is negative except mentioned above/history of present illness.   PAST MEDICAL HISTORY :  Past Medical History  Diagnosis Date  . Diabetes mellitus without complication ([place])   . Hypertension   . BPH (benign prostatic hyperplasia)   . Hyperlipidemia   . Arthritis   . Anxiety   . Cataract   . History of kidney stones   . OA (osteoarthritis)   . Prostatitis   . Urinary retention   . Elevated PSA   . UTI (lower urinary tract infection)   . Hearing loss     PAST SURGICAL HISTORY :   Past Surgical History  Procedure Laterality Date  . Back surgery    . Hernia repair    . Back surgery    . Cataract extraction       FAMILY HISTORY :   Family History  Problem Relation Age of Onset  . Throat cancer Father     SOCIAL HISTORY:   Social History  Substance Use Topics  . Smoking status: Former Smoker    Types: Cigarettes  . Smokeless tobacco: Never Used     Comment: quit 30 years ago  . Alcohol Use: No    ALLERGIES:  has No Known Allergies.  MEDICATIONS:  Current Outpatient Prescriptions  Medication Sig Dispense Refill  . atorvastatin (LIPITOR) 10 MG tablet Take 10 mg by mouth daily.    . enzalutamide (XTANDI) 40 MG capsule Take 4 capsules (160 mg total) by mouth daily. 120 capsule 3  . finasteride (PROSCAR) 5 MG tablet Take 1 tablet (5 mg total) by mouth daily. 30 tablet 3  . glipiZIDE (GLUCOTROL) 5 MG tablet Take 5 mg by mouth daily before breakfast. Reported on 07/24/2015    . HYDROcodone-acetaminophen (NORCO/VICODIN) 5-325 MG tablet Take 0.5-1 tablet by mouth every 6-8 hours as needed for pain 30 tablet 0   No current facility-administered medications for this visit.    PHYSICAL EXAMINATION: ECOG PERFORMANCE STATUS: 2 - Symptomatic, <50% confined to bed  BP 161/81 mmHg  Pulse 71  Temp(Src) 97.1 F (36.2 C) (Tympanic)  Resp 18  Wt 197 lb  9 oz (89.614 kg)  Filed Weights   08/25/15 1033  Weight: 197 lb 9 oz (89.614 kg)    GENERAL: Well-nourished well-developed; Alert, no distress and comfortable.  Accompanied by his son. Patient is wheelchair. EYES: no pallor or icterus OROPHARYNX: no thrush or ulceration; good dentition  NECK: supple, no masses felt LYMPH:  no palpable lymphadenopathy in the cervical, axillary or inguinal regions LUNGS: clear to auscultation and  No wheeze or crackles HEART/CVS: regular rate & rhythm and no murmurs; No lower extremity edema ABDOMEN:abdomen soft, non-tender and normal bowel sounds Musculoskeletal:no cyanosis of digits and no clubbing  PSYCH: alert & oriented x 3 with fluent speech NEURO: no focal motor/sensory deficits SKIN:  no rashes or  significant lesions  LABORATORY DATA:  I have reviewed the data as listed    Component Value Date/Time   NA 128* 08/25/2015 1015   K 4.6 08/25/2015 1015   CL 96* 08/25/2015 1015   CO2 22 08/25/2015 1015   GLUCOSE 285* 08/25/2015 1015   BUN 25* 08/25/2015 1015   CREATININE 0.91 08/25/2015 1015   CALCIUM 8.6* 08/25/2015 1015   PROT 7.9 08/25/2015 1015   ALBUMIN 3.6 08/25/2015 1015   AST 43* 08/25/2015 1015   ALT 24 08/25/2015 1015   ALKPHOS 510* 08/25/2015 1015   BILITOT 0.7 08/25/2015 1015   GFRNONAA >60 08/25/2015 1015   GFRAA >60 08/25/2015 1015    No results found for: SPEP, UPEP  Lab Results  Component Value Date   WBC 11.5* 08/25/2015   NEUTROABS 8.2* 08/25/2015   HGB 12.1* 08/25/2015   HCT 35.4* 08/25/2015   MCV 83.8 08/25/2015   PLT 316 08/25/2015      Chemistry      Component Value Date/Time   NA 128* 08/25/2015 1015   K 4.6 08/25/2015 1015   CL 96* 08/25/2015 1015   CO2 22 08/25/2015 1015   BUN 25* 08/25/2015 1015   CREATININE 0.91 08/25/2015 1015      Component Value Date/Time   CALCIUM 8.6* 08/25/2015 1015   ALKPHOS 510* 08/25/2015 1015   AST 43* 08/25/2015 1015   ALT 24 08/25/2015 1015   BILITOT 0.7 08/25/2015 1015       RADIOGRAPHIC STUDIES: I have personally reviewed the radiological images as listed and agreed with the findings in the report. No results found.   ASSESSMENT & PLAN:  Hyponatremia Sodium 128 recommend increased by mouth fluids especially with salt like chicken broth.;   Pain in lower back Multifactorial chronic arthritis/metastatic prostate cancer. Continue hydrocodone 1-2 pills a day.  Fall General debility/progressive malignancy-recommend caution. No concerns for seizures. Okay with X-tandi.   Prostate cancer metastatic to bone [facility]) Metastatic stage IV prostate cancer to the bone lymph nodes. Currently on first line ADT. Patient received Lupron on May 23; not due until August.  Patient appears to have transitioned  to castrate resistant phase- fairly quickly/approximately 6 months since starting ADT. This is a poor prognosis. Recommend adding Xtandi. This has been approved by insurance however issues with co-pay.   Financial difficulties Issues with co-pay/ recommend social worker evaluation..    Orders Placed This Encounter  Procedures  . CBC with Differential    Standing Status: Future     Number of Occurrences:      Standing Expiration Date: 08/24/2016  . Comprehensive metabolic panel    Standing Status: Future     Number of Occurrences:      Standing Expiration Date: 08/24/2016    Order Specific  Question:  Has the patient fasted?    Answer:  No  . Ambulatory referral to Social Work    Referral Priority:  Routine    Referral Type:  Consultation    Referral Reason:  Specialty Services Required    Referred to Provider:  Gillis Santa    Number of Visits Requested:  1   All questions were answered. The patient knows to call the clinic with any problems, questions or concerns. Patient follow-up with me in approximately 4 weeks/Xgeva/labs PSA     Cammie Sickle, MD 08/25/2015 6:46 PM

## 2015-08-25 NOTE — Assessment & Plan Note (Signed)
Issues with co-pay/ recommend social worker evaluation.Marland Kitchen

## 2015-08-25 NOTE — Assessment & Plan Note (Signed)
Multifactorial chronic arthritis/metastatic prostate cancer. Continue hydrocodone 1-2 pills a day.

## 2015-08-25 NOTE — Progress Notes (Signed)
Patient accompanied by son today who states his dad fell about 2 weeks ago.  Patient is weak today and c/o fatigue.  Patient unable to stand to weigh.  Weighed him in wheelchair.

## 2015-08-25 NOTE — Assessment & Plan Note (Signed)
Metastatic stage IV prostate cancer to the bone lymph nodes. Currently on first line ADT. Patient received Lupron on May 23; not due until August.  Patient appears to have transitioned to castrate resistant phase- fairly quickly/approximately 6 months since starting ADT. This is a poor prognosis. Recommend adding Xtandi. This has been approved by insurance however issues with co-pay.

## 2015-08-26 ENCOUNTER — Telehealth: Payer: Self-pay | Admitting: Internal Medicine

## 2015-08-26 NOTE — Telephone Encounter (Signed)
Spoke to patient's son Nicole Kindred regarding the elevated PSA > 1000; in general would recommend chemotherapy but given his age; might have difficulty tolerating chemotherapy. Xtandi in process. If unable to receive it by the end of the week to inform us. Dr.B

## 2015-08-27 ENCOUNTER — Telehealth: Payer: Self-pay | Admitting: *Deleted

## 2015-08-27 NOTE — Telephone Encounter (Signed)
[employer] contacted Nira Conn, RN at cancer center. Pt has been approved for financial assistance with xtandi. First month's shipment will be shipped in 3-5 business days.  For subsequent rx refills, pt was instructed to contact [facility] subsequent shipments.(at [phone]). [employer] [facility].  Dr. Rogue Bussing made aware that pt will receive his meds by the end of the week.

## 2015-09-15 ENCOUNTER — Other Ambulatory Visit: Payer: Self-pay | Admitting: Internal Medicine

## 2015-09-22 ENCOUNTER — Inpatient Hospital Stay: Payer: Medicare HMO

## 2015-09-22 ENCOUNTER — Other Ambulatory Visit: Payer: Self-pay

## 2015-09-22 ENCOUNTER — Inpatient Hospital Stay: Payer: Medicare HMO | Attending: Internal Medicine | Admitting: Internal Medicine

## 2015-09-22 VITALS — BP 119/74 | HR 73 | Temp 97.0°F | Resp 18 | Wt 200.6 lb

## 2015-09-22 DIAGNOSIS — N400 Enlarged prostate without lower urinary tract symptoms: Secondary | ICD-10-CM | POA: Diagnosis not present

## 2015-09-22 DIAGNOSIS — Z87440 Personal history of urinary (tract) infections: Secondary | ICD-10-CM | POA: Diagnosis not present

## 2015-09-22 DIAGNOSIS — C7951 Secondary malignant neoplasm of bone: Principal | ICD-10-CM

## 2015-09-22 DIAGNOSIS — E785 Hyperlipidemia, unspecified: Secondary | ICD-10-CM | POA: Diagnosis not present

## 2015-09-22 DIAGNOSIS — M129 Arthropathy, unspecified: Secondary | ICD-10-CM

## 2015-09-22 DIAGNOSIS — F17210 Nicotine dependence, cigarettes, uncomplicated: Secondary | ICD-10-CM | POA: Diagnosis not present

## 2015-09-22 DIAGNOSIS — G8929 Other chronic pain: Secondary | ICD-10-CM | POA: Insufficient documentation

## 2015-09-22 DIAGNOSIS — Z7984 Long term (current) use of oral hypoglycemic drugs: Secondary | ICD-10-CM | POA: Insufficient documentation

## 2015-09-22 DIAGNOSIS — E119 Type 2 diabetes mellitus without complications: Secondary | ICD-10-CM | POA: Diagnosis not present

## 2015-09-22 DIAGNOSIS — M549 Dorsalgia, unspecified: Secondary | ICD-10-CM | POA: Insufficient documentation

## 2015-09-22 DIAGNOSIS — C779 Secondary and unspecified malignant neoplasm of lymph node, unspecified: Secondary | ICD-10-CM

## 2015-09-22 DIAGNOSIS — Z79899 Other long term (current) drug therapy: Secondary | ICD-10-CM | POA: Diagnosis not present

## 2015-09-22 DIAGNOSIS — I10 Essential (primary) hypertension: Secondary | ICD-10-CM | POA: Insufficient documentation

## 2015-09-22 DIAGNOSIS — C61 Malignant neoplasm of prostate: Secondary | ICD-10-CM

## 2015-09-22 DIAGNOSIS — Z87442 Personal history of urinary calculi: Secondary | ICD-10-CM | POA: Diagnosis not present

## 2015-09-22 DIAGNOSIS — R339 Retention of urine, unspecified: Secondary | ICD-10-CM | POA: Insufficient documentation

## 2015-09-22 DIAGNOSIS — F419 Anxiety disorder, unspecified: Secondary | ICD-10-CM

## 2015-09-22 DIAGNOSIS — Z808 Family history of malignant neoplasm of other organs or systems: Secondary | ICD-10-CM | POA: Diagnosis not present

## 2015-09-22 DIAGNOSIS — M1990 Unspecified osteoarthritis, unspecified site: Secondary | ICD-10-CM

## 2015-09-22 LAB — CBC WITH DIFFERENTIAL/PLATELET
Basophils Absolute: 0.1 10*3/uL (ref 0–0.1)
Basophils Relative: 1 %
EOS ABS: 0.1 10*3/uL (ref 0–0.7)
EOS PCT: 2 %
HCT: 36.7 % — ABNORMAL LOW (ref 40.0–52.0)
HEMOGLOBIN: 12.2 g/dL — AB (ref 13.0–18.0)
LYMPHS ABS: 3.1 10*3/uL (ref 1.0–3.6)
Lymphocytes Relative: 42 %
MCH: 28.6 pg (ref 26.0–34.0)
MCHC: 33.2 g/dL (ref 32.0–36.0)
MCV: 86.1 fL (ref 80.0–100.0)
MONOS PCT: 9 %
Monocytes Absolute: 0.7 10*3/uL (ref 0.2–1.0)
Neutro Abs: 3.5 10*3/uL (ref 1.4–6.5)
Neutrophils Relative %: 46 %
PLATELETS: 250 10*3/uL (ref 150–440)
RBC: 4.26 MIL/uL — ABNORMAL LOW (ref 4.40–5.90)
RDW: 16.5 % — ABNORMAL HIGH (ref 11.5–14.5)
WBC: 7.6 10*3/uL (ref 3.8–10.6)

## 2015-09-22 LAB — COMPREHENSIVE METABOLIC PANEL
ALK PHOS: 467 U/L — AB (ref 38–126)
ALT: 20 U/L (ref 17–63)
ANION GAP: 6 (ref 5–15)
AST: 26 U/L (ref 15–41)
Albumin: 3.8 g/dL (ref 3.5–5.0)
BUN: 23 mg/dL — ABNORMAL HIGH (ref 6–20)
CALCIUM: 8.2 mg/dL — AB (ref 8.9–10.3)
CO2: 23 mmol/L (ref 22–32)
Chloride: 106 mmol/L (ref 101–111)
Creatinine, Ser: 0.81 mg/dL (ref 0.61–1.24)
GFR calc non Af Amer: >60 mL/min (ref >60)
Glucose, Bld: 220 mg/dL — ABNORMAL HIGH (ref 65–99)
Potassium: 4.4 mmol/L (ref 3.5–5.1)
SODIUM: 135 mmol/L (ref 135–145)
Total Bilirubin: 1 mg/dL (ref 0.3–1.2)
Total Protein: 7.3 g/dL (ref 6.5–8.1)

## 2015-09-22 LAB — PSA: PSA: 9.36 ng/mL — AB (ref 0.00–4.00)

## 2015-09-22 MED ORDER — DENOSUMAB 120 MG/1.7ML ~~LOC~~ SOLN
120.0000 mg | Freq: Once | SUBCUTANEOUS | Status: AC
  Administered 2015-09-22: 120 mg via SUBCUTANEOUS
  Filled 2015-09-22: qty 1.7

## 2015-09-22 NOTE — Assessment & Plan Note (Addendum)
Metastatic Castrate resistant prostate cancer; stage IV  [ mets to the bone lymph nodes. On X-tandi [July 1st 2017] with lupron q 3 M. Tolerating well X-tandi. We'll also evaluate for Xofigo.   # # Multifactorial chronic arthritis/metastatic prostate cancer. Continue hydrocodone 1-2 pills a day; improved. Continue X-geva.   # Sodium 128-at last visit; currently 135 improved.   # follow up in 1 months/ shots- lupron-X-geva/labs/psa.

## 2015-09-22 NOTE — Progress Notes (Signed)
[place] OFFICE PROGRESS NOTE  Patient Care Team: Marguerita Merles, MD as PCP - General (Family Medicine)  Prostate cancer metastatic to bone [facility])   Staging form: Prostate, AJCC 7th Edition     Clinical: Stage IV (T3b, N1, M1, PSA: 20 or greater) - Signed by Forest Gleason, MD on 01/13/2015    Oncology History   SEP 2016- METASTATIC PROSTATE CA to Bone/LN [PSA- [id number]; OCT 2016- ADT  # JAN-Feb 2017- CASTRATE RESISTANT PROSTATE CA; [name]; Lupron q3M [May 23rd 2017]; PSA- 1200  [June 2017]; July 1st 2017[name]      Prostate cancer metastatic to bone [facility])   12/31/2014 Initial Diagnosis Prostate cancer metastatic to bone [facility])     INTERVAL HISTORY:  Albert Fritz 82 y.o.  male pleasant patient above history of Metastatic prostate cancer the bone/lymph nodes. Patient has been on androgen deprivation therapy since October 2016. However his PSA has been rising more recently- Last visit PSA was 1200+  He started taking his Xtandi pills approximately 3 weeks ago.- He feels improved. No nausea no vomiting. No chest pain or shortness of breath. His chronic back pain for which he takes hydrocodone 1-2 pills a day. Appetite is improving.  REVIEW OF SYSTEMS:  A complete 10 point review of system is done which is negative except mentioned above/history of present illness.   PAST MEDICAL HISTORY :  Past Medical History  Diagnosis Date  . Diabetes mellitus without complication ([place])   . Hypertension   . BPH (benign prostatic hyperplasia)   . Hyperlipidemia   . Arthritis   . Anxiety   . Cataract   . History of kidney stones   . OA (osteoarthritis)   . Prostatitis   . Urinary retention   . Elevated PSA   . UTI (lower urinary tract infection)   . Hearing loss     PAST SURGICAL HISTORY :   Past Surgical History  Procedure Laterality Date  . Back surgery    . Hernia repair    . Back surgery    . Cataract extraction      FAMILY HISTORY :   Family  History  Problem Relation Age of Onset  . Throat cancer Father     SOCIAL HISTORY:   Social History  Substance Use Topics  . Smoking status: Former Smoker    Types: Cigarettes  . Smokeless tobacco: Never Used     Comment: quit 30 years ago  . Alcohol Use: No    ALLERGIES:  has No Known Allergies.  MEDICATIONS:  Current Outpatient Prescriptions  Medication Sig Dispense Refill  . atorvastatin (LIPITOR) 10 MG tablet Take 10 mg by mouth daily.    . finasteride (PROSCAR) 5 MG tablet Take 1 tablet (5 mg total) by mouth daily. 30 tablet 3  . glipiZIDE (GLUCOTROL) 5 MG tablet Take 5 mg by mouth daily before breakfast. Reported on 07/24/2015    . HYDROcodone-acetaminophen (NORCO/VICODIN) 5-325 MG tablet Take 0.5-1 tablet by mouth every 6-8 hours as needed for pain 30 tablet 0  . XTANDI 40 MG capsule TAKE 4 CAPSULES (160 MG TOTAL) BY MOUTH ONCE DAILY AT THE SAME TIME. MAY TAKE WITH OR WITHOUT FOOD. SWALLOW WHOLE. 120 capsule 12   No current facility-administered medications for this visit.    PHYSICAL EXAMINATION: ECOG PERFORMANCE STATUS: 2 - Symptomatic, <50% confined to bed  BP 119/74 mmHg  Pulse 73  Temp(Src) 97 F (36.1 C) (Tympanic)  Resp 18  Wt 200 lb 9 oz (90.975 kg)  Filed Weights   09/22/15 1116  Weight: 200 lb 9 oz (90.975 kg)    GENERAL: Well-nourished well-developed; Alert, no distress and comfortable.  Accompanied by his son. Patient is wheelchair. EYES: no pallor or icterus OROPHARYNX: no thrush or ulceration; good dentition  NECK: supple, no masses felt LYMPH:  no palpable lymphadenopathy in the cervical, axillary or inguinal regions LUNGS: clear to auscultation and  No wheeze or crackles HEART/CVS: regular rate & rhythm and no murmurs; No lower extremity edema ABDOMEN:abdomen soft, non-tender and normal bowel sounds Musculoskeletal:no cyanosis of digits and no clubbing  PSYCH: alert & oriented x 3 with fluent speech NEURO: no focal motor/sensory  deficits SKIN:  no rashes or significant lesions  LABORATORY DATA:  I have reviewed the data as listed    Component Value Date/Time   NA 135 09/22/2015 1050   K 4.4 09/22/2015 1050   CL 106 09/22/2015 1050   CO2 23 09/22/2015 1050   GLUCOSE 220* 09/22/2015 1050   BUN 23* 09/22/2015 1050   CREATININE 0.81 09/22/2015 1050   CALCIUM 8.2* 09/22/2015 1050   PROT 7.3 09/22/2015 1050   ALBUMIN 3.8 09/22/2015 1050   AST 26 09/22/2015 1050   ALT 20 09/22/2015 1050   ALKPHOS 467* 09/22/2015 1050   BILITOT 1.0 09/22/2015 1050   GFRNONAA >60 09/22/2015 1050   GFRAA >60 09/22/2015 1050    No results found for: SPEP, UPEP  Lab Results  Component Value Date   WBC 7.6 09/22/2015   NEUTROABS 3.5 09/22/2015   HGB 12.2* 09/22/2015   HCT 36.7* 09/22/2015   MCV 86.1 09/22/2015   PLT 250 09/22/2015      Chemistry      Component Value Date/Time   NA 135 09/22/2015 1050   K 4.4 09/22/2015 1050   CL 106 09/22/2015 1050   CO2 23 09/22/2015 1050   BUN 23* 09/22/2015 1050   CREATININE 0.81 09/22/2015 1050      Component Value Date/Time   CALCIUM 8.2* 09/22/2015 1050   ALKPHOS 467* 09/22/2015 1050   AST 26 09/22/2015 1050   ALT 20 09/22/2015 1050   BILITOT 1.0 09/22/2015 1050       RADIOGRAPHIC STUDIES: I have personally reviewed the radiological images as listed and agreed with the findings in the report. No results found.   ASSESSMENT & PLAN:  Prostate cancer metastatic to bone [facility]) Metastatic Castrate resistant prostate cancer; stage IV  [ mets to the bone lymph nodes. On X-tandi [July 1st 2017] with lupron q 3 M. Tolerating well X-tandi. We'll also evaluate for Xofigo.   # # Multifactorial chronic arthritis/metastatic prostate cancer. Continue hydrocodone 1-2 pills a day; improved. Continue X-geva.   # Sodium 128-at last visit; currently 135 improved.   # follow up in 1 months/ shots- lupron-X-geva/labs/psa.    No orders of the defined types were placed in this  encounter.   All questions were answered. The patient knows to call the clinic with any problems, questions or concerns. Patient follow-up with me in approximately 4 weeks/Xgeva/labs PSA     Cammie Sickle, MD 09/22/2015 5:25 PM

## 2015-09-22 NOTE — Progress Notes (Signed)
Patient scheduled to receive xgeva today. Ca is 8.2 corrected to 8.36. Called and spoke with Dr B. Will continue with therapy.

## 2015-09-29 ENCOUNTER — Telehealth: Payer: Self-pay | Admitting: *Deleted

## 2015-09-29 ENCOUNTER — Telehealth: Payer: Self-pay

## 2015-09-29 NOTE — Telephone Encounter (Signed)
Patient's son states his dad has not received his xtandi.

## 2015-09-29 NOTE — Telephone Encounter (Signed)
Called and spoke with pt's son who had called about pt's Xtandi refill.  [facility] will [facility] on Wednesday.  Son verbalized an understanding and I also gave son the phone # to call for refills.  No other concerns noted.

## 2015-09-30 NOTE — Telephone Encounter (Signed)
Nira Conn- can you please follow up? Thx

## 2015-10-21 ENCOUNTER — Other Ambulatory Visit: Payer: Self-pay | Admitting: *Deleted

## 2015-10-21 DIAGNOSIS — C61 Malignant neoplasm of prostate: Secondary | ICD-10-CM

## 2015-10-23 ENCOUNTER — Inpatient Hospital Stay (HOSPITAL_BASED_OUTPATIENT_CLINIC_OR_DEPARTMENT_OTHER): Payer: Medicare HMO | Admitting: Internal Medicine

## 2015-10-23 ENCOUNTER — Inpatient Hospital Stay: Payer: Medicare HMO

## 2015-10-23 ENCOUNTER — Ambulatory Visit
Admission: RE | Admit: 2015-10-23 | Discharge: 2015-10-23 | Disposition: A | Discharge status: Home / Self Care | Payer: Medicare HMO | Source: Ambulatory Visit | Attending: Internal Medicine | Admitting: Internal Medicine

## 2015-10-23 ENCOUNTER — Inpatient Hospital Stay: Payer: Medicare HMO | Attending: Internal Medicine

## 2015-10-23 VITALS — BP 132/80 | HR 74 | Temp 98.2°F | Resp 18 | Wt 204.1 lb

## 2015-10-23 DIAGNOSIS — I10 Essential (primary) hypertension: Secondary | ICD-10-CM | POA: Insufficient documentation

## 2015-10-23 DIAGNOSIS — M50323 Other cervical disc degeneration at C6-C7 level: Secondary | ICD-10-CM | POA: Diagnosis not present

## 2015-10-23 DIAGNOSIS — Z87891 Personal history of nicotine dependence: Secondary | ICD-10-CM | POA: Diagnosis not present

## 2015-10-23 DIAGNOSIS — C779 Secondary and unspecified malignant neoplasm of lymph node, unspecified: Secondary | ICD-10-CM | POA: Insufficient documentation

## 2015-10-23 DIAGNOSIS — Z7982 Long term (current) use of aspirin: Secondary | ICD-10-CM

## 2015-10-23 DIAGNOSIS — N400 Enlarged prostate without lower urinary tract symptoms: Secondary | ICD-10-CM

## 2015-10-23 DIAGNOSIS — C7951 Secondary malignant neoplasm of bone: Secondary | ICD-10-CM | POA: Insufficient documentation

## 2015-10-23 DIAGNOSIS — M542 Cervicalgia: Secondary | ICD-10-CM

## 2015-10-23 DIAGNOSIS — C61 Malignant neoplasm of prostate: Secondary | ICD-10-CM

## 2015-10-23 DIAGNOSIS — F419 Anxiety disorder, unspecified: Secondary | ICD-10-CM | POA: Diagnosis not present

## 2015-10-23 DIAGNOSIS — E785 Hyperlipidemia, unspecified: Secondary | ICD-10-CM

## 2015-10-23 DIAGNOSIS — Z87440 Personal history of urinary (tract) infections: Secondary | ICD-10-CM

## 2015-10-23 DIAGNOSIS — M129 Arthropathy, unspecified: Secondary | ICD-10-CM

## 2015-10-23 DIAGNOSIS — M1990 Unspecified osteoarthritis, unspecified site: Secondary | ICD-10-CM | POA: Insufficient documentation

## 2015-10-23 DIAGNOSIS — Z79818 Long term (current) use of other agents affecting estrogen receptors and estrogen levels: Secondary | ICD-10-CM | POA: Diagnosis not present

## 2015-10-23 DIAGNOSIS — Z87442 Personal history of urinary calculi: Secondary | ICD-10-CM

## 2015-10-23 DIAGNOSIS — G8929 Other chronic pain: Secondary | ICD-10-CM | POA: Diagnosis not present

## 2015-10-23 DIAGNOSIS — M549 Dorsalgia, unspecified: Secondary | ICD-10-CM

## 2015-10-23 DIAGNOSIS — E119 Type 2 diabetes mellitus without complications: Secondary | ICD-10-CM

## 2015-10-23 DIAGNOSIS — Z79899 Other long term (current) drug therapy: Secondary | ICD-10-CM

## 2015-10-23 LAB — COMPREHENSIVE METABOLIC PANEL
ALT: 19 U/L (ref 17–63)
ANION GAP: 5 (ref 5–15)
AST: 25 U/L (ref 15–41)
Albumin: 3.9 g/dL (ref 3.5–5.0)
Alkaline Phosphatase: 117 U/L (ref 38–126)
BUN: 25 mg/dL — ABNORMAL HIGH (ref 6–20)
CALCIUM: 8.6 mg/dL — AB (ref 8.9–10.3)
CHLORIDE: 111 mmol/L (ref 101–111)
CO2: 22 mmol/L (ref 22–32)
Creatinine, Ser: 0.81 mg/dL (ref 0.61–1.24)
Glucose, Bld: 216 mg/dL — ABNORMAL HIGH (ref 65–99)
Potassium: 4.7 mmol/L (ref 3.5–5.1)
SODIUM: 138 mmol/L (ref 135–145)
Total Bilirubin: 1.1 mg/dL (ref 0.3–1.2)
Total Protein: 7.3 g/dL (ref 6.5–8.1)

## 2015-10-23 LAB — CBC WITH DIFFERENTIAL/PLATELET
Basophils Absolute: 0 10*3/uL (ref 0–0.1)
Basophils Relative: 1 %
EOS ABS: 0.1 10*3/uL (ref 0–0.7)
EOS PCT: 1 %
HCT: 38.1 % — ABNORMAL LOW (ref 40.0–52.0)
Hemoglobin: 12.7 g/dL — ABNORMAL LOW (ref 13.0–18.0)
LYMPHS ABS: 3.4 10*3/uL (ref 1.0–3.6)
Lymphocytes Relative: 46 %
MCH: 29.5 pg (ref 26.0–34.0)
MCHC: 33.4 g/dL (ref 32.0–36.0)
MCV: 88.4 fL (ref 80.0–100.0)
MONO ABS: 0.6 10*3/uL (ref 0.2–1.0)
MONOS PCT: 9 %
Neutro Abs: 3.1 10*3/uL (ref 1.4–6.5)
Neutrophils Relative %: 43 %
PLATELETS: 233 10*3/uL (ref 150–440)
RBC: 4.31 MIL/uL — ABNORMAL LOW (ref 4.40–5.90)
RDW: 17.2 % — ABNORMAL HIGH (ref 11.5–14.5)
WBC: 7.2 10*3/uL (ref 3.8–10.6)

## 2015-10-23 LAB — PSA: PSA: 0.12 ng/mL (ref 0.00–4.00)

## 2015-10-23 MED ORDER — DENOSUMAB 120 MG/1.7ML ~~LOC~~ SOLN
120.0000 mg | Freq: Once | SUBCUTANEOUS | Status: AC
  Administered 2015-10-23: 120 mg via SUBCUTANEOUS
  Filled 2015-10-23: qty 1.7

## 2015-10-23 MED ORDER — LEUPROLIDE ACETATE (3 MONTH) 22.5 MG IM KIT
22.5000 mg | PACK | Freq: Once | INTRAMUSCULAR | Status: AC
  Administered 2015-10-23: 22.5 mg via INTRAMUSCULAR
  Filled 2015-10-23: qty 22.5

## 2015-10-23 NOTE — Assessment & Plan Note (Addendum)
Metastatic Castrate resistant prostate cancer; stage IV  [ mets to the bone lymph nodes. On X-tandi [July 1st 2017] with lupron q 3 M. Tolerating well X-tandi. PSA 1000- to 9 [july 2017]. Significant PSA response.  # # Multifactorial chronic arthritis/metastatic prostate cancer. Continue hydrocodone 1-2 pills a day; improved. Continue X-geva.   # neck pain- ? etilogy- check neck X-rays; if not improved check CT neck; discussed with son.   # Sodium 128-at last visit; currently 135 improved.   # follow up in 1 months/ shots- lupron-X-geva/labs/psa.

## 2015-10-23 NOTE — Progress Notes (Signed)
[place] OFFICE PROGRESS NOTE  Patient Care Team: Marguerita Merles, MD as PCP - General (Family Medicine)  Prostate cancer metastatic to bone [facility])   Staging form: Prostate, AJCC 7th Edition     Clinical: Stage IV (T3b, N1, M1, PSA: 20 or greater) - Signed by Forest Gleason, MD on 01/13/2015    Oncology History   SEP 2016- METASTATIC PROSTATE CA to Bone/LN [PSA- [id number]; OCT 2016- ADT  # JAN-Feb 2017- CASTRATE RESISTANT PROSTATE CA; [name]; Lupron q3M [May 23rd 2017]; PSA- 1200  [June 2017]; July 1st 2017[name]      Prostate cancer metastatic to bone [facility])   12/31/2014 Initial Diagnosis    Prostate cancer metastatic to bone [facility])        INTERVAL HISTORY:  Albert Fritz 82 y.o.  male pleasant patient above history of Metastatic prostate cancer the bone/lymph nodes. Patient has been on androgen deprivation therapy since October 2016. Patient currently on Xtandi since 09/06/2015.  Overall he feels improved. However he complains of pain in the neck especially with movement from side to side the last 2 weeks. He has been taking Tylenol with mild benefit.  Denies any headaches or nausea vomiting.   No nausea no vomiting. No chest pain or shortness of breath. His chronic back pain for which he takes hydrocodone 1-2 pills a day. Appetite is improving.  REVIEW OF SYSTEMS:  A complete 10 point review of system is done which is negative except mentioned above/history of present illness.   PAST MEDICAL HISTORY :  Past Medical History:  Diagnosis Date  . Anxiety   . Arthritis   . BPH (benign prostatic hyperplasia)   . Cataract   . Diabetes mellitus without complication ([place])   . Elevated PSA   . Hearing loss   . History of kidney stones   . Hyperlipidemia   . Hypertension   . OA (osteoarthritis)   . Prostatitis   . Urinary retention   . UTI (lower urinary tract infection)     PAST SURGICAL HISTORY :   Past Surgical History:  Procedure Laterality  Date  . BACK SURGERY    . BACK SURGERY    . CATARACT EXTRACTION    . HERNIA REPAIR      FAMILY HISTORY :   Family History  Problem Relation Age of Onset  . Throat cancer Father     SOCIAL HISTORY:   Social History  Substance Use Topics  . Smoking status: Former Smoker    Types: Cigarettes  . Smokeless tobacco: Never Used     Comment: quit 30 years ago  . Alcohol use No    ALLERGIES:  has No Known Allergies.  MEDICATIONS:  Current Outpatient Prescriptions  Medication Sig Dispense Refill  . acetaminophen (TYLENOL) 650 MG CR tablet Take 650 mg by mouth every 8 (eight) hours as needed for pain.    Marland Kitchen aspirin 81 MG chewable tablet Chew by mouth daily.    Marland Kitchen atorvastatin (LIPITOR) 10 MG tablet Take 10 mg by mouth daily.    . finasteride (PROSCAR) 5 MG tablet Take 1 tablet (5 mg total) by mouth daily. 30 tablet 3  . glipiZIDE (GLUCOTROL) 5 MG tablet Take 5 mg by mouth daily before breakfast. Reported on 07/24/2015    . XTANDI 40 MG capsule TAKE 4 CAPSULES (160 MG TOTAL) BY MOUTH ONCE DAILY AT THE SAME TIME. MAY TAKE WITH OR WITHOUT FOOD. SWALLOW WHOLE. 120 capsule 12  .  Vitamin D, Ergocalciferol, (DRISDOL) 50000 units CAPS capsule Take by mouth.     No current facility-administered medications for this visit.     PHYSICAL EXAMINATION: ECOG PERFORMANCE STATUS: 2 - Symptomatic, <50% confined to bed  BP 132/80 (BP Location: Left Arm, Patient Position: Sitting)   Pulse 74   Temp 98.2 F (36.8 C) (Tympanic)   Resp 18   Wt 204 lb 2 oz (92.6 kg)   BMI 25.51 kg/m   Filed Weights   10/23/15 1020  Weight: 204 lb 2 oz (92.6 kg)    GENERAL: Well-nourished well-developed; Alert, no distress and comfortable.  Accompanied by his son. Patient is wheelchair. EYES: no pallor or icterus OROPHARYNX: no thrush or ulceration; good dentition  NECK: supple, no masses felt LYMPH:  no palpable lymphadenopathy in the cervical, axillary or inguinal regions LUNGS: clear to auscultation and  No  wheeze or crackles HEART/CVS: regular rate & rhythm and no murmurs; No lower extremity edema ABDOMEN:abdomen soft, non-tender and normal bowel sounds Musculoskeletal:no cyanosis of digits and no clubbing  PSYCH: alert & oriented x 3 with fluent speech NEURO: no focal motor/sensory deficits SKIN:  no rashes or significant lesions  LABORATORY DATA:  I have reviewed the data as listed    Component Value Date/Time   NA 138 10/23/2015 0935   K 4.7 10/23/2015 0935   CL 111 10/23/2015 0935   CO2 22 10/23/2015 0935   GLUCOSE 216 (H) 10/23/2015 0935   BUN 25 (H) 10/23/2015 0935   CREATININE 0.81 10/23/2015 0935   CALCIUM 8.6 (L) 10/23/2015 0935   PROT 7.3 10/23/2015 0935   ALBUMIN 3.9 10/23/2015 0935   AST 25 10/23/2015 0935   ALT 19 10/23/2015 0935   ALKPHOS 117 10/23/2015 0935   BILITOT 1.1 10/23/2015 0935   GFRNONAA >60 10/23/2015 0935   GFRAA >60 10/23/2015 0935    No results found for: SPEP, UPEP  Lab Results  Component Value Date   WBC 7.2 10/23/2015   NEUTROABS 3.1 10/23/2015   HGB 12.7 (L) 10/23/2015   HCT 38.1 (L) 10/23/2015   MCV 88.4 10/23/2015   PLT 233 10/23/2015      Chemistry      Component Value Date/Time   NA 138 10/23/2015 0935   K 4.7 10/23/2015 0935   CL 111 10/23/2015 0935   CO2 22 10/23/2015 0935   BUN 25 (H) 10/23/2015 0935   CREATININE 0.81 10/23/2015 0935      Component Value Date/Time   CALCIUM 8.6 (L) 10/23/2015 0935   ALKPHOS 117 10/23/2015 0935   AST 25 10/23/2015 0935   ALT 19 10/23/2015 0935   BILITOT 1.1 10/23/2015 0935       RADIOGRAPHIC STUDIES: I have personally reviewed the radiological images as listed and agreed with the findings in the report. Dg Cervical Spine 2 Or 3 Views  Result Date: 10/23/2015 CLINICAL DATA:  2-3 weeks of posterior neck pain; prostate malignancy metastatic to the skeleton. EXAM: CERVICAL SPINE - 2-3 VIEW COMPARISON:  Nuclear bone scan of December 23, 2014 FINDINGS: The bones are subjectively  osteopenic. The vertebral bodies are preserved in height. There is disc space narrowing at C6-7. There are large anterior endplate osteophytes from C3 inferiorly. There is multilevel facet joint hypertrophy bilaterally. The odontoid is intact. The prevertebral soft tissue spaces are normal. IMPRESSION: Endplate spurring and facet joint hypertrophy at multiple levels bilaterally. Disc space narrowing at C6-7. These findings are consistent with osteoarthritis. There are no findings worrisome for metastatic disease.  Electronically Signed   By: David  Martinique [name].   On: 10/23/2015 13:54     ASSESSMENT & PLAN:  Prostate cancer metastatic to bone [facility]) Metastatic Castrate resistant prostate cancer; stage IV  [ mets to the bone lymph nodes. On X-tandi [July 1st 2017] with lupron q 3 M. Tolerating well X-tandi. PSA 1000- to 9 [july 2017]. Significant PSA response.  # # Multifactorial chronic arthritis/metastatic prostate cancer. Continue hydrocodone 1-2 pills a day; improved. Continue X-geva.   # neck pain- ? etilogy- check neck X-rays; if not improved check CT neck; discussed with son.   # Sodium 128-at last visit; currently 135 improved.   # follow up in 1 months/ shots- lupron-X-geva/labs/psa.   Orders Placed This Encounter  Procedures  . DG Cervical Spine 2 or 3 views    Standing Status:   Future    Number of Occurrences:   1    Standing Expiration Date:   12/22/2016    Order Specific Question:   Reason for Exam (SYMPTOM  OR DIAGNOSIS REQUIRED)    Answer:   neck pain    Order Specific Question:   Preferred imaging location?    Answer:   [facility]  . CBC with Differential    Standing Status:   Future    Standing Expiration Date:   11/23/2016  . Comprehensive metabolic panel    Standing Status:   Future    Standing Expiration Date:   11/23/2016  . PSA    Standing Status:   Future    Standing Expiration Date:   11/23/2016   All questions were answered. The patient knows to call the  clinic with any problems, questions or concerns. Patient follow-up with me in approximately 4 weeks/Xgeva/labs PSA     Cammie Sickle, MD 10/24/2015 7:53 AM

## 2015-10-23 NOTE — Progress Notes (Signed)
Patient complains of right shoulder pain for past two weeks.  States it is 8/10.

## 2015-10-24 NOTE — Progress Notes (Signed)
Called patient and LVM that x-rays showed arthritis.

## 2015-11-10 ENCOUNTER — Other Ambulatory Visit: Payer: Self-pay | Admitting: Urology

## 2015-11-10 DIAGNOSIS — N400 Enlarged prostate without lower urinary tract symptoms: Secondary | ICD-10-CM

## 2015-11-20 ENCOUNTER — Other Ambulatory Visit: Payer: Medicare HMO

## 2015-11-20 ENCOUNTER — Ambulatory Visit: Payer: Medicare HMO

## 2015-11-20 ENCOUNTER — Ambulatory Visit: Payer: Medicare HMO | Admitting: Internal Medicine

## 2015-11-24 ENCOUNTER — Other Ambulatory Visit: Payer: Self-pay

## 2015-11-24 ENCOUNTER — Inpatient Hospital Stay: Payer: Medicare HMO

## 2015-11-24 ENCOUNTER — Inpatient Hospital Stay: Payer: Medicare HMO | Attending: Internal Medicine | Admitting: Internal Medicine

## 2015-11-24 VITALS — BP 125/73 | HR 77 | Temp 96.7°F | Wt 207.5 lb

## 2015-11-24 DIAGNOSIS — C61 Malignant neoplasm of prostate: Secondary | ICD-10-CM | POA: Diagnosis not present

## 2015-11-24 DIAGNOSIS — E119 Type 2 diabetes mellitus without complications: Secondary | ICD-10-CM | POA: Diagnosis not present

## 2015-11-24 DIAGNOSIS — Z87440 Personal history of urinary (tract) infections: Secondary | ICD-10-CM | POA: Insufficient documentation

## 2015-11-24 DIAGNOSIS — Z79899 Other long term (current) drug therapy: Secondary | ICD-10-CM | POA: Diagnosis not present

## 2015-11-24 DIAGNOSIS — Z7982 Long term (current) use of aspirin: Secondary | ICD-10-CM | POA: Insufficient documentation

## 2015-11-24 DIAGNOSIS — C7951 Secondary malignant neoplasm of bone: Secondary | ICD-10-CM | POA: Diagnosis not present

## 2015-11-24 DIAGNOSIS — F419 Anxiety disorder, unspecified: Secondary | ICD-10-CM | POA: Diagnosis not present

## 2015-11-24 DIAGNOSIS — I10 Essential (primary) hypertension: Secondary | ICD-10-CM | POA: Insufficient documentation

## 2015-11-24 DIAGNOSIS — Z87891 Personal history of nicotine dependence: Secondary | ICD-10-CM | POA: Diagnosis not present

## 2015-11-24 DIAGNOSIS — Z87442 Personal history of urinary calculi: Secondary | ICD-10-CM | POA: Insufficient documentation

## 2015-11-24 DIAGNOSIS — Z808 Family history of malignant neoplasm of other organs or systems: Secondary | ICD-10-CM | POA: Diagnosis not present

## 2015-11-24 DIAGNOSIS — M1990 Unspecified osteoarthritis, unspecified site: Secondary | ICD-10-CM | POA: Diagnosis not present

## 2015-11-24 DIAGNOSIS — E785 Hyperlipidemia, unspecified: Secondary | ICD-10-CM | POA: Diagnosis not present

## 2015-11-24 DIAGNOSIS — M129 Arthropathy, unspecified: Secondary | ICD-10-CM | POA: Insufficient documentation

## 2015-11-24 LAB — COMPREHENSIVE METABOLIC PANEL
ALBUMIN: 3.9 g/dL (ref 3.5–5.0)
ALK PHOS: 65 U/L (ref 38–126)
ALT: 16 U/L — ABNORMAL LOW (ref 17–63)
ANION GAP: 7 (ref 5–15)
AST: 25 U/L (ref 15–41)
BILIRUBIN TOTAL: 0.9 mg/dL (ref 0.3–1.2)
BUN: 19 mg/dL (ref 6–20)
CALCIUM: 8.6 mg/dL — AB (ref 8.9–10.3)
CO2: 26 mmol/L (ref 22–32)
Chloride: 103 mmol/L (ref 101–111)
Creatinine, Ser: 0.77 mg/dL (ref 0.61–1.24)
Glucose, Bld: 191 mg/dL — ABNORMAL HIGH (ref 65–99)
POTASSIUM: 4 mmol/L (ref 3.5–5.1)
Sodium: 136 mmol/L (ref 135–145)
TOTAL PROTEIN: 7.3 g/dL (ref 6.5–8.1)

## 2015-11-24 LAB — CBC WITH DIFFERENTIAL/PLATELET
BASOS PCT: 1 %
Basophils Absolute: 0 10*3/uL (ref 0–0.1)
EOS ABS: 0.1 10*3/uL (ref 0–0.7)
Eosinophils Relative: 1 %
HCT: 39.2 % — ABNORMAL LOW (ref 40.0–52.0)
HEMOGLOBIN: 13.2 g/dL (ref 13.0–18.0)
LYMPHS ABS: 2.5 10*3/uL (ref 1.0–3.6)
Lymphocytes Relative: 37 %
MCH: 29.9 pg (ref 26.0–34.0)
MCHC: 33.8 g/dL (ref 32.0–36.0)
MCV: 88.4 fL (ref 80.0–100.0)
Monocytes Absolute: 0.5 10*3/uL (ref 0.2–1.0)
Monocytes Relative: 8 %
NEUTROS PCT: 53 %
Neutro Abs: 3.7 10*3/uL (ref 1.4–6.5)
Platelets: 204 10*3/uL (ref 150–440)
RBC: 4.44 MIL/uL (ref 4.40–5.90)
RDW: 15.7 % — ABNORMAL HIGH (ref 11.5–14.5)
WBC: 6.9 10*3/uL (ref 3.8–10.6)

## 2015-11-24 LAB — PSA: PSA: 0.03 ng/mL (ref 0.00–4.00)

## 2015-11-24 MED ORDER — DENOSUMAB 120 MG/1.7ML ~~LOC~~ SOLN
120.0000 mg | Freq: Once | SUBCUTANEOUS | Status: AC
  Administered 2015-11-24: 120 mg via SUBCUTANEOUS
  Filled 2015-11-24: qty 1.7

## 2015-11-24 NOTE — Assessment & Plan Note (Addendum)
Metastatic Castrate resistant prostate cancer; stage IV  [ mets to the bone lymph nodes. On X-tandi [July 1st 2017] with lupron q 3 M [again in Nov 2017]. Tolerating well X-tandi. PSA 1000- to 0.12 [ AUG 2017]. Significant PSA response.  # Multifactorial chronic arthritis/metastatic prostate cancer. Improved;  Continue X-geva. Calcium 8.6. Calcium and vitamin D. No jaw pain.  # follow up in 1 months/ shots- X-geva/labs/psa.

## 2015-11-24 NOTE — Progress Notes (Signed)
[place] OFFICE PROGRESS NOTE  Patient Care Team: Marguerita Merles, MD as PCP - General (Family Medicine)  Prostate cancer metastatic to bone [facility])   Staging form: Prostate, AJCC 7th Edition     Clinical: Stage IV (T3b, N1, M1, PSA: 20 or greater) - Signed by Forest Gleason, MD on 01/13/2015    Oncology History   SEP 2016- METASTATIC PROSTATE CA to Bone/LN [PSA- [id number]; OCT 2016- ADT  # JAN-Feb 2017- CASTRATE RESISTANT PROSTATE CA; [name]; Lupron q3M [May 23rd 2017]; PSA- 1200  [June 2017]; July 1st 2017[name]      Prostate cancer metastatic to bone [facility])   12/31/2014 Initial Diagnosis    Prostate cancer metastatic to bone [facility])        INTERVAL HISTORY:  Albert Fritz 82 y.o.  male pleasant patient above history of Metastatic prostate cancer the bone/lymph nodes. Patient has been on androgen deprivation therapy since October 2016. Patient currently on Xtandi since 09/06/2015.  His appetite is improving. He is gaining weight. Denies any significant aches and pains. No nausea no vomiting.  He felt significantly improved since been on Xtandi.   No nausea no vomiting. No chest pain or shortness of breath.  REVIEW OF SYSTEMS:  A complete 10 point review of system is done which is negative except mentioned above/history of present illness.   PAST MEDICAL HISTORY :  Past Medical History:  Diagnosis Date  . Anxiety   . Arthritis   . BPH (benign prostatic hyperplasia)   . Cataract   . Diabetes mellitus without complication ([place])   . Elevated PSA   . Hearing loss   . History of kidney stones   . Hyperlipidemia   . Hypertension   . OA (osteoarthritis)   . Prostatitis   . Urinary retention   . UTI (lower urinary tract infection)     PAST SURGICAL HISTORY :   Past Surgical History:  Procedure Laterality Date  . BACK SURGERY    . BACK SURGERY    . CATARACT EXTRACTION    . HERNIA REPAIR      FAMILY HISTORY :   Family History  Problem  Relation Age of Onset  . Throat cancer Father     SOCIAL HISTORY:   Social History  Substance Use Topics  . Smoking status: Former Smoker    Types: Cigarettes  . Smokeless tobacco: Never Used     Comment: quit 30 years ago  . Alcohol use No    ALLERGIES:  has No Known Allergies.  MEDICATIONS:  Current Outpatient Prescriptions  Medication Sig Dispense Refill  . acetaminophen (TYLENOL) 650 MG CR tablet Take 650 mg by mouth every 8 (eight) hours as needed for pain.    Marland Kitchen aspirin 81 MG chewable tablet Chew by mouth daily.    Marland Kitchen atorvastatin (LIPITOR) 10 MG tablet Take 10 mg by mouth daily.    . finasteride (PROSCAR) 5 MG tablet TAKE ONE TABLET BY MOUTH ONCE DAILY 30 tablet 2  . glipiZIDE (GLUCOTROL) 5 MG tablet Take 5 mg by mouth daily before breakfast. Reported on 07/24/2015    . Vitamin D, Ergocalciferol, (DRISDOL) 50000 units CAPS capsule Take by mouth.    [name] 40 MG capsule TAKE 4 CAPSULES (160 MG TOTAL) BY MOUTH ONCE DAILY AT THE SAME TIME. MAY TAKE WITH OR WITHOUT FOOD. SWALLOW WHOLE. 120 capsule 12   No current facility-administered medications for this visit.     PHYSICAL EXAMINATION: ECOG  PERFORMANCE STATUS: 2 - Symptomatic, <50% confined to bed  BP 125/73 (BP Location: Left Arm, Patient Position: Sitting)   Pulse 77   Temp (!) 96.7 F (35.9 C) (Tympanic)   Wt 207 lb 8 oz (94.1 kg)   BMI 25.94 kg/m   Filed Weights   11/24/15 1153  Weight: 207 lb 8 oz (94.1 kg)    GENERAL: Well-nourished well-developed; Alert, no distress and comfortable.  Accompanied by his son. Patient is wheelchair. EYES: no pallor or icterus OROPHARYNX: no thrush or ulceration; good dentition  NECK: supple, no masses felt LYMPH:  no palpable lymphadenopathy in the cervical, axillary or inguinal regions LUNGS: clear to auscultation and  No wheeze or crackles HEART/CVS: regular rate & rhythm and no murmurs; No lower extremity edema ABDOMEN:abdomen soft, non-tender and normal bowel  sounds Musculoskeletal:no cyanosis of digits and no clubbing  PSYCH: alert & oriented x 3 with fluent speech NEURO: no focal motor/sensory deficits SKIN:  no rashes or significant lesions  LABORATORY DATA:  I have reviewed the data as listed    Component Value Date/Time   NA 136 11/24/2015 1107   K 4.0 11/24/2015 1107   CL 103 11/24/2015 1107   CO2 26 11/24/2015 1107   GLUCOSE 191 (H) 11/24/2015 1107   BUN 19 11/24/2015 1107   CREATININE 0.77 11/24/2015 1107   CALCIUM 8.6 (L) 11/24/2015 1107   PROT 7.3 11/24/2015 1107   ALBUMIN 3.9 11/24/2015 1107   AST 25 11/24/2015 1107   ALT 16 (L) 11/24/2015 1107   ALKPHOS 65 11/24/2015 1107   BILITOT 0.9 11/24/2015 1107   GFRNONAA >60 11/24/2015 1107   GFRAA >60 11/24/2015 1107    No results found for: SPEP, UPEP  Lab Results  Component Value Date   WBC 6.9 11/24/2015   NEUTROABS 3.7 11/24/2015   HGB 13.2 11/24/2015   HCT 39.2 (L) 11/24/2015   MCV 88.4 11/24/2015   PLT 204 11/24/2015      Chemistry      Component Value Date/Time   NA 136 11/24/2015 1107   K 4.0 11/24/2015 1107   CL 103 11/24/2015 1107   CO2 26 11/24/2015 1107   BUN 19 11/24/2015 1107   CREATININE 0.77 11/24/2015 1107      Component Value Date/Time   CALCIUM 8.6 (L) 11/24/2015 1107   ALKPHOS 65 11/24/2015 1107   AST 25 11/24/2015 1107   ALT 16 (L) 11/24/2015 1107   BILITOT 0.9 11/24/2015 1107       RADIOGRAPHIC STUDIES: I have personally reviewed the radiological images as listed and agreed with the findings in the report. No results found.   ASSESSMENT & PLAN:  Prostate cancer metastatic to bone [facility]) Metastatic Castrate resistant prostate cancer; stage IV  [ mets to the bone lymph nodes. On X-tandi [July 1st 2017] with lupron q 3 M [again in Nov 2017]. Tolerating well X-tandi. PSA 1000- to 0.12 [ AUG 2017]. Significant PSA response.  # Multifactorial chronic arthritis/metastatic prostate cancer. Improved;  Continue X-geva. Calcium 8.6. Calcium  and vitamin D. No jaw pain.  # follow up in 1 months/ shots- X-geva/labs/psa.   Orders Placed This Encounter  Procedures  . CBC with Differential    Standing Status:   Future    Standing Expiration Date:   11/23/2016  . Comprehensive metabolic panel    Standing Status:   Future    Standing Expiration Date:   11/23/2016  . PSA    Standing Status:   Future  Standing Expiration Date:   11/23/2016   All questions were answered. The patient knows to call the clinic with any problems, questions or concerns. Patient follow-up with me in approximately 4 weeks/Xgeva/labs PSA     Cammie Sickle, MD 11/24/2015 1:15 PM

## 2015-11-24 NOTE — Progress Notes (Signed)
Patient brought to exam room 17 via wheelchair, accompanied by hi son.  Patient denies pain or discomfort at this time.  Vitals stable and documented

## 2015-12-22 ENCOUNTER — Inpatient Hospital Stay: Payer: Medicare HMO

## 2015-12-22 ENCOUNTER — Inpatient Hospital Stay: Payer: Medicare HMO | Attending: Internal Medicine

## 2015-12-22 ENCOUNTER — Inpatient Hospital Stay (HOSPITAL_BASED_OUTPATIENT_CLINIC_OR_DEPARTMENT_OTHER): Payer: Medicare HMO | Admitting: Internal Medicine

## 2015-12-22 ENCOUNTER — Encounter: Payer: Self-pay | Admitting: Internal Medicine

## 2015-12-22 VITALS — BP 114/72 | HR 74 | Temp 96.1°F | Resp 18 | Ht 75.0 in | Wt 206.3 lb

## 2015-12-22 DIAGNOSIS — Z87442 Personal history of urinary calculi: Secondary | ICD-10-CM | POA: Insufficient documentation

## 2015-12-22 DIAGNOSIS — Z7982 Long term (current) use of aspirin: Secondary | ICD-10-CM

## 2015-12-22 DIAGNOSIS — C779 Secondary and unspecified malignant neoplasm of lymph node, unspecified: Secondary | ICD-10-CM | POA: Diagnosis not present

## 2015-12-22 DIAGNOSIS — Z808 Family history of malignant neoplasm of other organs or systems: Secondary | ICD-10-CM | POA: Diagnosis not present

## 2015-12-22 DIAGNOSIS — F419 Anxiety disorder, unspecified: Secondary | ICD-10-CM | POA: Insufficient documentation

## 2015-12-22 DIAGNOSIS — Z79899 Other long term (current) drug therapy: Secondary | ICD-10-CM | POA: Insufficient documentation

## 2015-12-22 DIAGNOSIS — E1165 Type 2 diabetes mellitus with hyperglycemia: Secondary | ICD-10-CM | POA: Insufficient documentation

## 2015-12-22 DIAGNOSIS — M129 Arthropathy, unspecified: Secondary | ICD-10-CM

## 2015-12-22 DIAGNOSIS — R339 Retention of urine, unspecified: Secondary | ICD-10-CM | POA: Insufficient documentation

## 2015-12-22 DIAGNOSIS — R972 Elevated prostate specific antigen [PSA]: Secondary | ICD-10-CM | POA: Diagnosis not present

## 2015-12-22 DIAGNOSIS — J210 Acute bronchiolitis due to respiratory syncytial virus: Secondary | ICD-10-CM

## 2015-12-22 DIAGNOSIS — M1990 Unspecified osteoarthritis, unspecified site: Secondary | ICD-10-CM | POA: Diagnosis not present

## 2015-12-22 DIAGNOSIS — E785 Hyperlipidemia, unspecified: Secondary | ICD-10-CM

## 2015-12-22 DIAGNOSIS — N400 Enlarged prostate without lower urinary tract symptoms: Secondary | ICD-10-CM | POA: Insufficient documentation

## 2015-12-22 DIAGNOSIS — I10 Essential (primary) hypertension: Secondary | ICD-10-CM | POA: Insufficient documentation

## 2015-12-22 DIAGNOSIS — C7951 Secondary malignant neoplasm of bone: Secondary | ICD-10-CM

## 2015-12-22 DIAGNOSIS — Z87891 Personal history of nicotine dependence: Secondary | ICD-10-CM

## 2015-12-22 DIAGNOSIS — C61 Malignant neoplasm of prostate: Secondary | ICD-10-CM | POA: Diagnosis not present

## 2015-12-22 DIAGNOSIS — Z7984 Long term (current) use of oral hypoglycemic drugs: Secondary | ICD-10-CM

## 2015-12-22 LAB — CBC WITH DIFFERENTIAL/PLATELET
Basophils Absolute: 0 K/uL (ref 0–0.1)
Basophils Relative: 0 %
Eosinophils Absolute: 0.1 K/uL (ref 0–0.7)
Eosinophils Relative: 2 %
HCT: 41 % (ref 40.0–52.0)
Hemoglobin: 14 g/dL (ref 13.0–18.0)
Lymphocytes Relative: 40 %
Lymphs Abs: 3.3 K/uL (ref 1.0–3.6)
MCH: 30.6 pg (ref 26.0–34.0)
MCHC: 34 g/dL (ref 32.0–36.0)
MCV: 90 fL (ref 80.0–100.0)
Monocytes Absolute: 0.6 K/uL (ref 0.2–1.0)
Monocytes Relative: 7 %
Neutro Abs: 4.2 K/uL (ref 1.4–6.5)
Neutrophils Relative %: 51 %
Platelets: 217 K/uL (ref 150–440)
RBC: 4.56 MIL/uL (ref 4.40–5.90)
RDW: 14.7 % — ABNORMAL HIGH (ref 11.5–14.5)
WBC: 8.3 K/uL (ref 3.8–10.6)

## 2015-12-22 LAB — COMPREHENSIVE METABOLIC PANEL
ALBUMIN: 3.9 g/dL (ref 3.5–5.0)
ALT: 17 U/L (ref 17–63)
AST: 25 U/L (ref 15–41)
Alkaline Phosphatase: 65 U/L (ref 38–126)
Anion gap: 10 (ref 5–15)
BUN: 22 mg/dL — AB (ref 6–20)
CHLORIDE: 103 mmol/L (ref 101–111)
CO2: 25 mmol/L (ref 22–32)
Calcium: 9 mg/dL (ref 8.9–10.3)
Creatinine, Ser: 1.07 mg/dL (ref 0.61–1.24)
GFR calc Af Amer: >60 mL/min (ref >60)
GFR calc non Af Amer: >60 mL/min (ref >60)
GLUCOSE: 236 mg/dL — AB (ref 65–99)
POTASSIUM: 4.5 mmol/L (ref 3.5–5.1)
Sodium: 138 mmol/L (ref 135–145)
Total Bilirubin: 1.1 mg/dL (ref 0.3–1.2)
Total Protein: 7.6 g/dL (ref 6.5–8.1)

## 2015-12-22 LAB — PSA: PSA: 0.06 ng/mL (ref 0.00–4.00)

## 2015-12-22 MED ORDER — DENOSUMAB 120 MG/1.7ML ~~LOC~~ SOLN
120.0000 mg | Freq: Once | SUBCUTANEOUS | Status: AC
  Administered 2015-12-22: 120 mg via SUBCUTANEOUS
  Filled 2015-12-22: qty 1.7

## 2015-12-22 NOTE — Progress Notes (Signed)
No concerns or troubles

## 2015-12-22 NOTE — Progress Notes (Signed)
[place] OFFICE PROGRESS NOTE  Patient Care Team: Marguerita Merles, MD as PCP - General (Family Medicine)  Prostate cancer metastatic to bone [facility])   Staging form: Prostate, AJCC 7th Edition     Clinical: Stage IV (T3b, N1, M1, PSA: 20 or greater) - Signed by Forest Gleason, MD on 01/13/2015    Oncology History   SEP 2016- METASTATIC PROSTATE CA to Bone/LN [PSA- [id number]; OCT 2016- ADT  # JAN-Feb 2017- CASTRATE RESISTANT PROSTATE CA; [name]; Lupron q3M [May 23rd 2017]; PSA- 1200  [June 2017]; July 1st 2017[name]      Prostate cancer metastatic to bone [facility])   12/31/2014 Initial Diagnosis    Prostate cancer metastatic to bone [facility])        INTERVAL HISTORY:  Albert Fritz 82 y.o.  male pleasant patient above history of Metastatic prostate cancer the bone/lymph nodes. Patient has been on androgen deprivation therapy since October 2016. Patient currently on Xtandi since 09/06/2015.  Denies any significant aches and pains. No nausea no vomiting. His appetite is improving. He is gaining weight.  No complaints from X-tandi;  No nausea no vomiting. No chest pain or shortness of breath.  REVIEW OF SYSTEMS:  A complete 10 point review of system is done which is negative except mentioned above/history of present illness.   PAST MEDICAL HISTORY :  Past Medical History:  Diagnosis Date  . Anxiety   . Arthritis   . BPH (benign prostatic hyperplasia)   . Cataract   . Diabetes mellitus without complication ([place])   . Elevated PSA   . Hearing loss   . History of kidney stones   . Hyperlipidemia   . Hypertension   . OA (osteoarthritis)   . Prostatitis   . Urinary retention   . UTI (lower urinary tract infection)     PAST SURGICAL HISTORY :   Past Surgical History:  Procedure Laterality Date  . BACK SURGERY    . BACK SURGERY    . CATARACT EXTRACTION    . HERNIA REPAIR      FAMILY HISTORY :   Family History  Problem Relation Age of Onset  . Throat  cancer Father     SOCIAL HISTORY:   Social History  Substance Use Topics  . Smoking status: Former Smoker    Types: Cigarettes  . Smokeless tobacco: Never Used     Comment: quit 30 years ago  . Alcohol use No    ALLERGIES:  has No Known Allergies.  MEDICATIONS:  Current Outpatient Prescriptions  Medication Sig Dispense Refill  . acetaminophen (TYLENOL) 650 MG CR tablet Take 650 mg by mouth every 8 (eight) hours as needed for pain.    Marland Kitchen aspirin 81 MG chewable tablet Chew by mouth daily.    Marland Kitchen atorvastatin (LIPITOR) 10 MG tablet Take 10 mg by mouth daily.    . finasteride (PROSCAR) 5 MG tablet TAKE ONE TABLET BY MOUTH ONCE DAILY 30 tablet 2  . glipiZIDE (GLUCOTROL) 5 MG tablet Take 5 mg by mouth daily before breakfast. Reported on 07/24/2015    . Vitamin D, Ergocalciferol, (DRISDOL) 50000 units CAPS capsule Take by mouth.    [name] 40 MG capsule TAKE 4 CAPSULES (160 MG TOTAL) BY MOUTH ONCE DAILY AT THE SAME TIME. MAY TAKE WITH OR WITHOUT FOOD. SWALLOW WHOLE. 120 capsule 12   No current facility-administered medications for this visit.     PHYSICAL EXAMINATION: ECOG PERFORMANCE STATUS: 2 - Symptomatic, <  50% confined to bed  BP 114/72 (BP Location: Left Arm, Patient Position: Sitting)   Pulse 74   Temp (!) 96.1 F (35.6 C) (Tympanic)   Resp 18   Ht 6\' 3"  (1.905 m)   Wt 206 lb 4.8 oz (93.6 kg)   BMI 25.79 kg/m   Filed Weights   12/22/15 1113  Weight: 206 lb 4.8 oz (93.6 kg)    GENERAL: Well-nourished well-developed; Alert, no distress and comfortable.  Accompanied by his son. Patient is wheelchair. EYES: no pallor or icterus OROPHARYNX: no thrush or ulceration; good dentition  NECK: supple, no masses felt LYMPH:  no palpable lymphadenopathy in the cervical, axillary or inguinal regions LUNGS: clear to auscultation and  No wheeze or crackles HEART/CVS: regular rate & rhythm and no murmurs; No lower extremity edema ABDOMEN:abdomen soft, non-tender and normal bowel  sounds Musculoskeletal:no cyanosis of digits and no clubbing  PSYCH: alert & oriented x 3 with fluent speech NEURO: no focal motor/sensory deficits SKIN:  no rashes or significant lesions  LABORATORY DATA:  I have reviewed the data as listed    Component Value Date/Time   NA 138 12/22/2015 0950   K 4.5 12/22/2015 0950   CL 103 12/22/2015 0950   CO2 25 12/22/2015 0950   GLUCOSE 236 (H) 12/22/2015 0950   BUN 22 (H) 12/22/2015 0950   CREATININE 1.07 12/22/2015 0950   CALCIUM 9.0 12/22/2015 0950   PROT 7.6 12/22/2015 0950   ALBUMIN 3.9 12/22/2015 0950   AST 25 12/22/2015 0950   ALT 17 12/22/2015 0950   ALKPHOS 65 12/22/2015 0950   BILITOT 1.1 12/22/2015 0950   GFRNONAA >60 12/22/2015 0950   GFRAA >60 12/22/2015 0950    No results found for: SPEP, UPEP  Lab Results  Component Value Date   WBC 8.3 12/22/2015   NEUTROABS 4.2 12/22/2015   HGB 14.0 12/22/2015   HCT 41.0 12/22/2015   MCV 90.0 12/22/2015   PLT 217 12/22/2015      Chemistry      Component Value Date/Time   NA 138 12/22/2015 0950   K 4.5 12/22/2015 0950   CL 103 12/22/2015 0950   CO2 25 12/22/2015 0950   BUN 22 (H) 12/22/2015 0950   CREATININE 1.07 12/22/2015 0950      Component Value Date/Time   CALCIUM 9.0 12/22/2015 0950   ALKPHOS 65 12/22/2015 0950   AST 25 12/22/2015 0950   ALT 17 12/22/2015 0950   BILITOT 1.1 12/22/2015 0950       RADIOGRAPHIC STUDIES: I have personally reviewed the radiological images as listed and agreed with the findings in the report. No results found.   ASSESSMENT & PLAN:  Prostate cancer metastatic to bone [facility]) Metastatic Castrate resistant prostate cancer; stage IV  [ mets to the bone lymph nodes. On X-tandi [July 1st 2017] with lupron q 3 M [again in Nov 2017]. Tolerating well X-tandi. PSA 1000- to 0.0.3[ SEP 2017]. Significant PSA response.  # Multifactorial chronic arthritis/metastatic prostate cancer. Improved;  Continue X-geva. Calcium 9.2 Calcium and vitamin  D. No jaw pain.  # Increased blood sugars- recommend dietary discretion.   # follow up in 1 months/ shots- X-geva/labs/psa.   Orders Placed This Encounter  Procedures  . CBC with Differential    Standing Status:   Future    Standing Expiration Date:   12/21/2016  . Comprehensive metabolic panel    Standing Status:   Future    Standing Expiration Date:   12/21/2016  .  PSA    Standing Status:   Future    Standing Expiration Date:   12/21/2016   All questions were answered. The patient knows to call the clinic with any problems, questions or concerns.     Cammie Sickle, MD 12/22/2015 1:07 PM

## 2015-12-22 NOTE — Assessment & Plan Note (Signed)
Metastatic Castrate resistant prostate cancer; stage IV  [ mets to the bone lymph nodes. On X-tandi [July 1st 2017] with lupron q 3 M [again in Nov 2017]. Tolerating well X-tandi. PSA 1000- to 0.0.3[ SEP 2017]. Significant PSA response.  # Multifactorial chronic arthritis/metastatic prostate cancer. Improved;  Continue X-geva. Calcium 9.2 Calcium and vitamin D. No jaw pain.  # Increased blood sugars- recommend dietary discretion.   # follow up in 1 months/ shots- X-geva/labs/psa.

## 2016-01-22 ENCOUNTER — Other Ambulatory Visit: Payer: Medicare HMO

## 2016-01-22 ENCOUNTER — Ambulatory Visit: Payer: Medicare HMO | Admitting: Internal Medicine

## 2016-01-22 ENCOUNTER — Ambulatory Visit: Payer: Medicare HMO

## 2016-01-26 ENCOUNTER — Inpatient Hospital Stay: Payer: Medicare HMO | Attending: Internal Medicine

## 2016-01-26 ENCOUNTER — Inpatient Hospital Stay: Payer: Medicare HMO

## 2016-01-26 ENCOUNTER — Inpatient Hospital Stay (HOSPITAL_BASED_OUTPATIENT_CLINIC_OR_DEPARTMENT_OTHER): Payer: Medicare HMO | Admitting: Internal Medicine

## 2016-01-26 VITALS — BP 139/84 | HR 75 | Temp 96.5°F | Resp 18 | Wt 210.1 lb

## 2016-01-26 DIAGNOSIS — Z87891 Personal history of nicotine dependence: Secondary | ICD-10-CM

## 2016-01-26 DIAGNOSIS — R9721 Rising PSA following treatment for malignant neoplasm of prostate: Secondary | ICD-10-CM | POA: Diagnosis not present

## 2016-01-26 DIAGNOSIS — E1165 Type 2 diabetes mellitus with hyperglycemia: Secondary | ICD-10-CM | POA: Diagnosis not present

## 2016-01-26 DIAGNOSIS — C779 Secondary and unspecified malignant neoplasm of lymph node, unspecified: Secondary | ICD-10-CM

## 2016-01-26 DIAGNOSIS — C7951 Secondary malignant neoplasm of bone: Secondary | ICD-10-CM | POA: Diagnosis not present

## 2016-01-26 DIAGNOSIS — E785 Hyperlipidemia, unspecified: Secondary | ICD-10-CM

## 2016-01-26 DIAGNOSIS — Z7982 Long term (current) use of aspirin: Secondary | ICD-10-CM

## 2016-01-26 DIAGNOSIS — N400 Enlarged prostate without lower urinary tract symptoms: Secondary | ICD-10-CM | POA: Diagnosis not present

## 2016-01-26 DIAGNOSIS — Z87440 Personal history of urinary (tract) infections: Secondary | ICD-10-CM | POA: Diagnosis not present

## 2016-01-26 DIAGNOSIS — Z79899 Other long term (current) drug therapy: Secondary | ICD-10-CM

## 2016-01-26 DIAGNOSIS — Z79818 Long term (current) use of other agents affecting estrogen receptors and estrogen levels: Secondary | ICD-10-CM | POA: Diagnosis not present

## 2016-01-26 DIAGNOSIS — I10 Essential (primary) hypertension: Secondary | ICD-10-CM | POA: Insufficient documentation

## 2016-01-26 DIAGNOSIS — F419 Anxiety disorder, unspecified: Secondary | ICD-10-CM

## 2016-01-26 DIAGNOSIS — Z87442 Personal history of urinary calculi: Secondary | ICD-10-CM

## 2016-01-26 DIAGNOSIS — M129 Arthropathy, unspecified: Secondary | ICD-10-CM

## 2016-01-26 DIAGNOSIS — C61 Malignant neoplasm of prostate: Secondary | ICD-10-CM

## 2016-01-26 DIAGNOSIS — Z808 Family history of malignant neoplasm of other organs or systems: Secondary | ICD-10-CM | POA: Insufficient documentation

## 2016-01-26 DIAGNOSIS — M1990 Unspecified osteoarthritis, unspecified site: Secondary | ICD-10-CM

## 2016-01-26 LAB — CBC WITH DIFFERENTIAL/PLATELET
BASOS PCT: 1 %
Basophils Absolute: 0.1 10*3/uL (ref 0–0.1)
Eosinophils Absolute: 0.1 10*3/uL (ref 0–0.7)
Eosinophils Relative: 1 %
HEMATOCRIT: 38.5 % — AB (ref 40.0–52.0)
HEMOGLOBIN: 13 g/dL (ref 13.0–18.0)
LYMPHS ABS: 3.1 10*3/uL (ref 1.0–3.6)
LYMPHS PCT: 40 %
MCH: 30.2 pg (ref 26.0–34.0)
MCHC: 33.9 g/dL (ref 32.0–36.0)
MCV: 89.1 fL (ref 80.0–100.0)
MONOS PCT: 7 %
Monocytes Absolute: 0.5 10*3/uL (ref 0.2–1.0)
NEUTROS ABS: 4.2 10*3/uL (ref 1.4–6.5)
NEUTROS PCT: 51 %
Platelets: 221 10*3/uL (ref 150–440)
RBC: 4.32 MIL/uL — ABNORMAL LOW (ref 4.40–5.90)
RDW: 14.6 % — ABNORMAL HIGH (ref 11.5–14.5)
WBC: 8 10*3/uL (ref 3.8–10.6)

## 2016-01-26 LAB — COMPREHENSIVE METABOLIC PANEL
ALBUMIN: 3.6 g/dL (ref 3.5–5.0)
ALK PHOS: 49 U/L (ref 38–126)
ALT: 17 U/L (ref 17–63)
ANION GAP: 9 (ref 5–15)
AST: 28 U/L (ref 15–41)
BILIRUBIN TOTAL: 1 mg/dL (ref 0.3–1.2)
BUN: 20 mg/dL (ref 6–20)
CALCIUM: 8.8 mg/dL — AB (ref 8.9–10.3)
CO2: 25 mmol/L (ref 22–32)
Chloride: 102 mmol/L (ref 101–111)
Creatinine, Ser: 0.89 mg/dL (ref 0.61–1.24)
GFR calc Af Amer: >60 mL/min (ref >60)
GLUCOSE: 246 mg/dL — AB (ref 65–99)
Potassium: 4.2 mmol/L (ref 3.5–5.1)
Sodium: 136 mmol/L (ref 135–145)
TOTAL PROTEIN: 7.2 g/dL (ref 6.5–8.1)

## 2016-01-26 LAB — PSA: PSA: 0.19 ng/mL (ref 0.00–4.00)

## 2016-01-26 MED ORDER — DENOSUMAB 120 MG/1.7ML ~~LOC~~ SOLN
120.0000 mg | Freq: Once | SUBCUTANEOUS | Status: AC
  Administered 2016-01-26: 120 mg via SUBCUTANEOUS
  Filled 2016-01-26: qty 1.7

## 2016-01-26 MED ORDER — LEUPROLIDE ACETATE (3 MONTH) 22.5 MG IM KIT
22.5000 mg | PACK | Freq: Once | INTRAMUSCULAR | Status: AC
  Administered 2016-01-26: 22.5 mg via INTRAMUSCULAR
  Filled 2016-01-26: qty 22.5

## 2016-01-26 NOTE — Progress Notes (Signed)
Patient is here for follow up, he is doing well 

## 2016-01-26 NOTE — Assessment & Plan Note (Addendum)
Metastatic Castrate resistant prostate cancer; stage IV  [ mets to the bone lymph nodes. On X-tandi [July 1st 2017] with lupron q 3 M [last in Nov 2017]. Tolerating well X-tandi. PSA 1000- to 0.06 [ oct 2017]. Significant PSA response.  # Multifactorial chronic arthritis/metastatic prostate cancer. Improved;  Continue X-geva. Calcium 9.2 Calcium and vitamin D. No jaw pain.  # Increased blood sugars- BG 236; recommend dietary discretion.   # follow up in 1 months/ shots- X-geva/labs/psa.

## 2016-01-26 NOTE — Progress Notes (Signed)
[place] OFFICE PROGRESS NOTE  Patient Care Team: Albert Merles, MD as PCP - General (Family Medicine)  Prostate cancer metastatic to bone [facility])   Staging form: Prostate, AJCC 7th Edition     Clinical: Stage IV (T3b, N1, M1, PSA: 20 or greater) - Signed by Albert Gleason, MD on 01/13/2015    Oncology History   SEP 2016- METASTATIC PROSTATE CA to Bone/LN [PSA- [id number]; OCT 2016- ADT  # JAN-Feb 2017- CASTRATE RESISTANT PROSTATE CA; Albert Fritz M; Lupron q3M [May 23rd 2017]; PSA- 1200  [June 2017]; July 1st 2017Buddy Fritz X-tandi      Prostate cancer metastatic to bone [facility])   12/31/2014 Initial Diagnosis    Prostate cancer metastatic to bone [facility])        INTERVAL HISTORY:  Albert Fritz 82 y.o.  male pleasant patient above history of Metastatic prostate cancer the bone/lymph nodes. Patient has been on androgen deprivation therapy since October 2016. Patient currently on Xtandi since 09/06/2015.  Appetite improving. No weight loss. No chest pain or shortness of breath or cough. No significant fatigue  No nausea no vomiting. No chest pain or shortness of breath. No bone pain.  REVIEW OF SYSTEMS:  A complete 10 point review of system is done which is negative except mentioned above/history of present illness.   PAST MEDICAL HISTORY :  Past Medical History:  Diagnosis Date  . Anxiety   . Arthritis   . BPH (benign prostatic hyperplasia)   . Cataract   . Diabetes mellitus without complication ([place])   . Elevated PSA   . Hearing loss   . History of kidney stones   . Hyperlipidemia   . Hypertension   . OA (osteoarthritis)   . Prostatitis   . Urinary retention   . UTI (lower urinary tract infection)     PAST SURGICAL HISTORY :   Past Surgical History:  Procedure Laterality Date  . BACK SURGERY    . BACK SURGERY    . CATARACT EXTRACTION    . HERNIA REPAIR      FAMILY HISTORY :   Family History  Problem Relation Age of Onset  . Throat cancer Father      SOCIAL HISTORY:   Social History  Substance Use Topics  . Smoking status: Former Smoker    Types: Cigarettes  . Smokeless tobacco: Never Used     Comment: quit 30 years ago  . Alcohol use No    ALLERGIES:  has No Known Allergies.  MEDICATIONS:  Current Outpatient Prescriptions  Medication Sig Dispense Refill  . acetaminophen (TYLENOL) 650 MG CR tablet Take 650 mg by mouth every 8 (eight) hours as needed for pain.    Marland Kitchen aspirin 81 MG chewable tablet Chew by mouth daily.    Marland Kitchen atorvastatin (LIPITOR) 10 MG tablet Take 10 mg by mouth daily.    . finasteride (PROSCAR) 5 MG tablet TAKE ONE TABLET BY MOUTH ONCE DAILY 30 tablet 2  . glipiZIDE (GLUCOTROL) 5 MG tablet Take 5 mg by mouth daily before breakfast. Reported on 07/24/2015    . Vitamin D, Ergocalciferol, (DRISDOL) 50000 units CAPS capsule Take by mouth.    Albert Fritz 40 MG capsule TAKE 4 CAPSULES (160 MG TOTAL) BY MOUTH ONCE DAILY AT THE SAME TIME. MAY TAKE WITH OR WITHOUT FOOD. SWALLOW WHOLE. 120 capsule 12   No current facility-administered medications for this visit.     PHYSICAL EXAMINATION: ECOG PERFORMANCE STATUS: 2 - Symptomatic, <50% confined to  bed  BP 139/84 (BP Location: Left Arm, Patient Position: Sitting)   Pulse 75   Temp (!) 96.5 F (35.8 C) (Tympanic)   Resp 18   Wt 210 lb 1.6 oz (95.3 kg)   BMI 26.26 kg/m   Filed Weights   01/26/16 1131  Weight: 210 lb 1.6 oz (95.3 kg)    GENERAL: Well-nourished well-developed; Alert, no distress and comfortable.  Accompanied by his son. Patient is wheelchair. EYES: no pallor or icterus OROPHARYNX: no thrush or ulceration; good dentition  NECK: supple, no masses felt LYMPH:  no palpable lymphadenopathy in the cervical, axillary or inguinal regions LUNGS: clear to auscultation and  No wheeze or crackles HEART/CVS: regular rate & rhythm and no murmurs; No lower extremity edema ABDOMEN:abdomen soft, non-tender and normal bowel sounds Musculoskeletal:no cyanosis of  digits and no clubbing  PSYCH: alert & oriented x 3 with fluent speech NEURO: no focal motor/sensory deficits SKIN:  no rashes or significant lesions  LABORATORY DATA:  I have reviewed the data as listed    Component Value Date/Time   NA 136 01/26/2016 1038   K 4.2 01/26/2016 1038   CL 102 01/26/2016 1038   CO2 25 01/26/2016 1038   GLUCOSE 246 (H) 01/26/2016 1038   BUN 20 01/26/2016 1038   CREATININE 0.89 01/26/2016 1038   CALCIUM 8.8 (L) 01/26/2016 1038   PROT 7.2 01/26/2016 1038   ALBUMIN 3.6 01/26/2016 1038   AST 28 01/26/2016 1038   ALT 17 01/26/2016 1038   ALKPHOS 49 01/26/2016 1038   BILITOT 1.0 01/26/2016 1038   GFRNONAA >60 01/26/2016 1038   GFRAA >60 01/26/2016 1038    No results found for: SPEP, UPEP  Lab Results  Component Value Date   WBC 8.0 01/26/2016   NEUTROABS 4.2 01/26/2016   HGB 13.0 01/26/2016   HCT 38.5 (L) 01/26/2016   MCV 89.1 01/26/2016   PLT 221 01/26/2016      Chemistry      Component Value Date/Time   NA 136 01/26/2016 1038   K 4.2 01/26/2016 1038   CL 102 01/26/2016 1038   CO2 25 01/26/2016 1038   BUN 20 01/26/2016 1038   CREATININE 0.89 01/26/2016 1038      Component Value Date/Time   CALCIUM 8.8 (L) 01/26/2016 1038   ALKPHOS 49 01/26/2016 1038   AST 28 01/26/2016 1038   ALT 17 01/26/2016 1038   BILITOT 1.0 01/26/2016 1038     Results for Albert, Fritz (MRN [id number]) as of 01/26/2016 12:01  Ref. Range 08/25/2015 10:15 09/22/2015 10:50 10/23/2015 09:35 11/24/2015 11:07 12/22/2015 09:50  PSA Latest Ref Range: 0.00 - 4.00 ng/mL 1,024.00 (H) 9.36 (H) 0.12 0.03 0.06    RADIOGRAPHIC STUDIES: I have personally reviewed the radiological images as listed and agreed with the findings in the report. No results found.   ASSESSMENT & PLAN:  Prostate cancer metastatic to bone [facility]) Metastatic Castrate resistant prostate cancer; stage IV  [ mets to the bone lymph nodes. On X-tandi [July 1st 2017] with lupron q 3 M [last in Nov 2017].  Tolerating well X-tandi. PSA 1000- to 0.06 [ oct 2017]. Significant PSA response.  # Multifactorial chronic arthritis/metastatic prostate cancer. Improved;  Continue X-geva. Calcium 9.2 Calcium and vitamin D. No jaw pain.  # Increased blood sugars- BG 236; recommend dietary discretion.   # follow up in 1 months/ shots- X-geva/labs/psa.   Orders Placed This Encounter  Procedures  . Comprehensive metabolic panel    Standing Status:  Future    Standing Expiration Date:   01/25/2017  . CBC with Differential    Standing Status:   Future    Standing Expiration Date:   01/25/2017  . PSA    Standing Status:   Future    Standing Expiration Date:   01/25/2017   All questions were answered. The patient knows to call the clinic with any problems, questions or concerns.     Cammie Sickle, MD 01/26/2016 12:51 PM

## 2016-02-11 ENCOUNTER — Other Ambulatory Visit: Payer: Self-pay | Admitting: Urology

## 2016-02-11 DIAGNOSIS — N400 Enlarged prostate without lower urinary tract symptoms: Secondary | ICD-10-CM

## 2016-02-11 NOTE — Telephone Encounter (Signed)
Pt pharmacy sent a refill of finasteride. Refill denied. Pt has not been seen since 12/2014 and pt has not f/u to get firmagon injection.

## 2016-02-23 ENCOUNTER — Inpatient Hospital Stay: Payer: Medicare HMO

## 2016-02-23 ENCOUNTER — Inpatient Hospital Stay: Payer: Medicare HMO | Attending: Internal Medicine | Admitting: Internal Medicine

## 2016-02-23 VITALS — BP 130/80 | HR 78 | Temp 97.6°F | Resp 22 | Ht 75.0 in | Wt 209.0 lb

## 2016-02-23 DIAGNOSIS — M129 Arthropathy, unspecified: Secondary | ICD-10-CM | POA: Diagnosis not present

## 2016-02-23 DIAGNOSIS — C779 Secondary and unspecified malignant neoplasm of lymph node, unspecified: Secondary | ICD-10-CM

## 2016-02-23 DIAGNOSIS — R410 Disorientation, unspecified: Secondary | ICD-10-CM | POA: Insufficient documentation

## 2016-02-23 DIAGNOSIS — F419 Anxiety disorder, unspecified: Secondary | ICD-10-CM | POA: Insufficient documentation

## 2016-02-23 DIAGNOSIS — E785 Hyperlipidemia, unspecified: Secondary | ICD-10-CM

## 2016-02-23 DIAGNOSIS — C61 Malignant neoplasm of prostate: Secondary | ICD-10-CM | POA: Insufficient documentation

## 2016-02-23 DIAGNOSIS — Z7982 Long term (current) use of aspirin: Secondary | ICD-10-CM | POA: Insufficient documentation

## 2016-02-23 DIAGNOSIS — Z87891 Personal history of nicotine dependence: Secondary | ICD-10-CM | POA: Insufficient documentation

## 2016-02-23 DIAGNOSIS — Z808 Family history of malignant neoplasm of other organs or systems: Secondary | ICD-10-CM | POA: Insufficient documentation

## 2016-02-23 DIAGNOSIS — M1990 Unspecified osteoarthritis, unspecified site: Secondary | ICD-10-CM | POA: Diagnosis not present

## 2016-02-23 DIAGNOSIS — C7951 Secondary malignant neoplasm of bone: Principal | ICD-10-CM

## 2016-02-23 DIAGNOSIS — Z79899 Other long term (current) drug therapy: Secondary | ICD-10-CM | POA: Insufficient documentation

## 2016-02-23 DIAGNOSIS — I10 Essential (primary) hypertension: Secondary | ICD-10-CM | POA: Insufficient documentation

## 2016-02-23 DIAGNOSIS — N400 Enlarged prostate without lower urinary tract symptoms: Secondary | ICD-10-CM | POA: Insufficient documentation

## 2016-02-23 DIAGNOSIS — Z7984 Long term (current) use of oral hypoglycemic drugs: Secondary | ICD-10-CM | POA: Insufficient documentation

## 2016-02-23 DIAGNOSIS — E119 Type 2 diabetes mellitus without complications: Secondary | ICD-10-CM | POA: Insufficient documentation

## 2016-02-23 DIAGNOSIS — Z87442 Personal history of urinary calculi: Secondary | ICD-10-CM | POA: Insufficient documentation

## 2016-02-23 LAB — COMPREHENSIVE METABOLIC PANEL
ALBUMIN: 3.9 g/dL (ref 3.5–5.0)
ALK PHOS: 56 U/L (ref 38–126)
ALT: 17 U/L (ref 17–63)
ANION GAP: 8 (ref 5–15)
AST: 27 U/L (ref 15–41)
BUN: 21 mg/dL — ABNORMAL HIGH (ref 6–20)
CALCIUM: 9.1 mg/dL (ref 8.9–10.3)
CO2: 27 mmol/L (ref 22–32)
Chloride: 102 mmol/L (ref 101–111)
Creatinine, Ser: 0.76 mg/dL (ref 0.61–1.24)
GFR calc Af Amer: >60 mL/min (ref >60)
GFR calc non Af Amer: >60 mL/min (ref >60)
GLUCOSE: 167 mg/dL — AB (ref 65–99)
Potassium: 4.5 mmol/L (ref 3.5–5.1)
SODIUM: 137 mmol/L (ref 135–145)
Total Bilirubin: 1.3 mg/dL — ABNORMAL HIGH (ref 0.3–1.2)
Total Protein: 7.5 g/dL (ref 6.5–8.1)

## 2016-02-23 LAB — CBC WITH DIFFERENTIAL/PLATELET
Basophils Absolute: 0 10*3/uL (ref 0–0.1)
Basophils Relative: 1 %
Eosinophils Absolute: 0.1 10*3/uL (ref 0–0.7)
Eosinophils Relative: 1 %
HEMATOCRIT: 39.2 % — AB (ref 40.0–52.0)
Hemoglobin: 13.3 g/dL (ref 13.0–18.0)
LYMPHS ABS: 3 10*3/uL (ref 1.0–3.6)
LYMPHS PCT: 38 %
MCH: 30.9 pg (ref 26.0–34.0)
MCHC: 34 g/dL (ref 32.0–36.0)
MCV: 90.8 fL (ref 80.0–100.0)
MONO ABS: 0.8 10*3/uL (ref 0.2–1.0)
MONOS PCT: 10 %
NEUTROS ABS: 3.9 10*3/uL (ref 1.4–6.5)
Neutrophils Relative %: 50 %
Platelets: 213 10*3/uL (ref 150–440)
RBC: 4.31 MIL/uL — ABNORMAL LOW (ref 4.40–5.90)
RDW: 15.1 % — AB (ref 11.5–14.5)
WBC: 7.8 10*3/uL (ref 3.8–10.6)

## 2016-02-23 LAB — PSA: PSA: 0.55 ng/mL (ref 0.00–4.00)

## 2016-02-23 MED ORDER — DENOSUMAB 120 MG/1.7ML ~~LOC~~ SOLN
120.0000 mg | Freq: Once | SUBCUTANEOUS | Status: AC
  Administered 2016-02-23: 120 mg via SUBCUTANEOUS
  Filled 2016-02-23: qty 1.7

## 2016-02-23 NOTE — Progress Notes (Signed)
Patient has not missed any dosing. Denies any side effects with xstandi.

## 2016-02-23 NOTE — Assessment & Plan Note (Signed)
Metastatic Castrate resistant prostate cancer; stage IV  [ mets to the bone lymph nodes. On X-tandi [July 1st 2017] with lupron q 3 M [last in Nov 2017]. Tolerating well X-tandi. PSA 1000- to 0.19 [ NOV 2017]. Significant PSA response.  # Multifactorial chronic arthritis/metastatic prostate cancer. Improved;  Continue X-geva. Calcium 9.2 Calcium and vitamin D. No jaw pain.  # Increased blood sugars- BG- 167- improved today.    # Delirium- ? Defer to PCP. Discussed with son/pt.   # follow up in 2 months/ shots [stretch to 8 w]- X-geva/labs/psa/ Lupron.   CC: Dr.Miles.

## 2016-02-23 NOTE — Progress Notes (Signed)
[name] OFFICE PROGRESS NOTE  Patient Care Team: Albert Merles, MD as PCP - General (Family Medicine)  Prostate cancer metastatic to bone [facility])   Staging form: Prostate, AJCC 7th Edition     Clinical: Stage IV (T3b, N1, M1, PSA: 20 or greater) - Signed by Albert Gleason, MD on 01/13/2015    Oncology History   SEP 2016- METASTATIC PROSTATE CA to Bone/LN [PSA- [id number]; OCT 2016- ADT  # JAN-Feb 2017- CASTRATE RESISTANT PROSTATE CA; Albert Fritz M; Lupron q3M [May 23rd 2017]; PSA- 1200  [June 2017]; July 1st 2017Buddy Fritz X-tandi      Prostate cancer metastatic to bone [facility])   12/31/2014 Initial Diagnosis    Prostate cancer metastatic to bone [facility])        INTERVAL HISTORY:  Albert Fritz 82 y.o.  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 Patient has been on androgen deprivation therapy since October 2016. Patient currently on Xtandi since 09/06/2015.   As per the patient's son patient is having episodes of confusion; he was recently brought home by a neighbor after he was found wandering in the neighborhood. Patient does not have good recollection. He states that he is overall feeling well. He is being more active.  Appetite improving. No weight loss. No chest pain or shortness of breath or cough. No significant fatigue  No nausea no vomiting. No chest pain or shortness of breath. No bone pain.  REVIEW OF SYSTEMS:  A complete 10 point review of system is done which is negative except mentioned above/history of present illness.   PAST MEDICAL HISTORY :  Past Medical History:  Diagnosis Date  . Anxiety   . Arthritis   . BPH (benign prostatic hyperplasia)   . Cataract   . Diabetes mellitus without complication ([place])   . Elevated PSA   . Hearing loss   . History of kidney stones   . Hyperlipidemia   . Hypertension   . OA (osteoarthritis)   . Prostatitis   . Urinary retention   . UTI (lower urinary  tract infection)     PAST SURGICAL HISTORY :   Past Surgical History:  Procedure Laterality Date  . BACK SURGERY    . BACK SURGERY    . CATARACT EXTRACTION    . HERNIA REPAIR      FAMILY HISTORY :   Family History  Problem Relation Age of Onset  . Throat cancer Father     SOCIAL HISTORY:   Social History  Substance Use Topics  . Smoking status: Former Smoker    Types: Cigarettes  . Smokeless tobacco: Never Used     Comment: quit 30 years ago  . Alcohol use No    ALLERGIES:  has No Known Allergies.  MEDICATIONS:  Current Outpatient Prescriptions  Medication Sig Dispense Refill  . acetaminophen (TYLENOL) 650 MG CR tablet Take 650 mg by mouth every 8 (eight) hours as needed for pain.    Marland Kitchen aspirin 81 MG chewable tablet Chew 81 mg by mouth 2 (two) times daily.     Marland Kitchen atorvastatin (LIPITOR) 10 MG tablet Take 10 mg by mouth daily.    Marland Kitchen glipiZIDE (GLUCOTROL) 5 MG tablet Take 5 mg by mouth daily before breakfast. Reported on 07/24/2015    . XTANDI 40 MG capsule TAKE 4 CAPSULES (160 MG TOTAL) BY MOUTH ONCE DAILY AT THE SAME TIME. MAY TAKE WITH OR WITHOUT FOOD. SWALLOW WHOLE. 120 capsule 12  .  Vitamin D, Ergocalciferol, (DRISDOL) 50000 units CAPS capsule Take 50,000 Units by mouth every 7 (seven) days.      No current facility-administered medications for this visit.     PHYSICAL EXAMINATION: ECOG PERFORMANCE STATUS: 2 - Symptomatic, <50% confined to bed  BP 130/80   Pulse 78   Temp 97.6 F (36.4 C) (Tympanic)   Resp (!) 22   Ht 6\' 3"  (1.905 m)   Wt 209 lb (94.8 kg)   BMI 26.12 kg/m   Filed Weights   02/23/16 1000  Weight: 209 lb (94.8 kg)    GENERAL: Well-nourished well-developed; Alert, no distress and comfortable.  Accompanied by his son. Patient is wheelchair. EYES: no pallor or icterus OROPHARYNX: no thrush or ulceration; good dentition  NECK: supple, no masses felt LYMPH:  no palpable lymphadenopathy in the cervical, axillary or inguinal regions LUNGS: clear  to auscultation and  No wheeze or crackles HEART/CVS: regular rate & rhythm and no murmurs; No lower extremity edema ABDOMEN:abdomen soft, non-tender and normal bowel sounds Musculoskeletal:no cyanosis of digits and no clubbing  PSYCH: alert & oriented x 3 with fluent speech NEURO: no focal motor/sensory deficits SKIN:  no rashes or significant lesions  LABORATORY DATA:  I have reviewed the data as listed    Component Value Date/Time   NA 137 02/23/2016 0953   K 4.5 02/23/2016 0953   CL 102 02/23/2016 0953   CO2 27 02/23/2016 0953   GLUCOSE 167 (H) 02/23/2016 0953   BUN 21 (H) 02/23/2016 0953   CREATININE 0.76 02/23/2016 0953   CALCIUM 9.1 02/23/2016 0953   PROT 7.5 02/23/2016 0953   ALBUMIN 3.9 02/23/2016 0953   AST 27 02/23/2016 0953   ALT 17 02/23/2016 0953   ALKPHOS 56 02/23/2016 0953   BILITOT 1.3 (H) 02/23/2016 0953   GFRNONAA >60 02/23/2016 0953   GFRAA >60 02/23/2016 0953    No results found for: SPEP, UPEP  Lab Results  Component Value Date   WBC 7.8 02/23/2016   NEUTROABS 3.9 02/23/2016   HGB 13.3 02/23/2016   HCT 39.2 (L) 02/23/2016   MCV 90.8 02/23/2016   PLT 213 02/23/2016      Chemistry      Component Value Date/Time   NA 137 02/23/2016 0953   K 4.5 02/23/2016 0953   CL 102 02/23/2016 0953   CO2 27 02/23/2016 0953   BUN 21 (H) 02/23/2016 0953   CREATININE 0.76 02/23/2016 0953      Component Value Date/Time   CALCIUM 9.1 02/23/2016 0953   ALKPHOS 56 02/23/2016 0953   AST 27 02/23/2016 0953   ALT 17 02/23/2016 0953   BILITOT 1.3 (H) 02/23/2016 0953     Results for Albert Fritz, Albert Fritz (MRN [id number]) as of 01/26/2016 12:01  Ref. Range 08/25/2015 10:15 09/22/2015 10:50 10/23/2015 09:35 11/24/2015 11:07 12/22/2015 09:50  PSA Latest Ref Range: 0.00 - 4.00 ng/mL 1,024.00 (H) 9.36 (H) 0.12 0.03 0.06    RADIOGRAPHIC STUDIES: I have personally reviewed the radiological images as listed and agreed with the findings in the report. No results found.    ASSESSMENT & PLAN:  Prostate cancer metastatic to bone [facility]) Metastatic Castrate resistant prostate cancer; stage IV  [ mets to the bone lymph nodes. On X-tandi [July 1st 2017] with lupron q 3 M [last in Nov 2017]. Tolerating well X-tandi. PSA 1000- to 0.19 [ NOV 2017]. Significant PSA response.  # Multifactorial chronic arthritis/metastatic prostate cancer. Improved;  Continue X-geva. Calcium 9.2 Calcium and vitamin D.  No jaw pain.  # Increased blood sugars- BG- 167- improved today.    # Delirium- ? Defer to PCP. Discussed with son/pt.   # follow up in 2 months/ shots [stretch to 8 w]- X-geva/labs/psa/ Lupron.   CC: Dr.Miles.   Orders Placed This Encounter  Procedures  . NM Bone Scan Whole Body    Standing Status:   Future    Standing Expiration Date:   04/24/2017    Order Specific Question:   Reason for Exam (SYMPTOM  OR DIAGNOSIS REQUIRED)    Answer:   prostate cancer    Order Specific Question:   Preferred imaging location?    Answer:   [facility]  . Comprehensive metabolic panel    Standing Status:   Future    Standing Expiration Date:   02/22/2017  . CBC with Differential    Standing Status:   Future    Standing Expiration Date:   02/22/2017  . PSA    Standing Status:   Future    Standing Expiration Date:   02/22/2017   All questions were answered. The patient knows to call the clinic with any problems, questions or concerns.     Cammie Sickle, MD 02/24/2016 8:09 AM

## 2016-02-25 ENCOUNTER — Ambulatory Visit: Payer: Medicare HMO

## 2016-02-25 ENCOUNTER — Other Ambulatory Visit: Payer: Medicare HMO

## 2016-02-25 ENCOUNTER — Ambulatory Visit: Payer: Medicare HMO | Admitting: Internal Medicine

## 2016-04-25 ENCOUNTER — Emergency Department: Payer: Medicare HMO

## 2016-04-25 ENCOUNTER — Encounter: Payer: Self-pay | Admitting: Emergency Medicine

## 2016-04-25 ENCOUNTER — Emergency Department
Admission: EM | Admit: 2016-04-25 | Discharge: 2016-04-25 | Disposition: A | Discharge status: Home / Self Care | Payer: Medicare HMO | Attending: Emergency Medicine | Admitting: Emergency Medicine

## 2016-04-25 DIAGNOSIS — Y92009 Unspecified place in unspecified non-institutional (private) residence as the place of occurrence of the external cause: Secondary | ICD-10-CM | POA: Insufficient documentation

## 2016-04-25 DIAGNOSIS — Z79899 Other long term (current) drug therapy: Secondary | ICD-10-CM | POA: Diagnosis not present

## 2016-04-25 DIAGNOSIS — Y939 Activity, unspecified: Secondary | ICD-10-CM | POA: Diagnosis not present

## 2016-04-25 DIAGNOSIS — Z7984 Long term (current) use of oral hypoglycemic drugs: Secondary | ICD-10-CM | POA: Diagnosis not present

## 2016-04-25 DIAGNOSIS — Z7982 Long term (current) use of aspirin: Secondary | ICD-10-CM | POA: Insufficient documentation

## 2016-04-25 DIAGNOSIS — I10 Essential (primary) hypertension: Secondary | ICD-10-CM | POA: Diagnosis not present

## 2016-04-25 DIAGNOSIS — Y999 Unspecified external cause status: Secondary | ICD-10-CM | POA: Insufficient documentation

## 2016-04-25 DIAGNOSIS — Z87891 Personal history of nicotine dependence: Secondary | ICD-10-CM | POA: Diagnosis not present

## 2016-04-25 DIAGNOSIS — R930 Abnormal findings on diagnostic imaging of skull and head, not elsewhere classified: Secondary | ICD-10-CM | POA: Diagnosis not present

## 2016-04-25 DIAGNOSIS — W1789XA Other fall from one level to another, initial encounter: Secondary | ICD-10-CM | POA: Diagnosis not present

## 2016-04-25 DIAGNOSIS — E119 Type 2 diabetes mellitus without complications: Secondary | ICD-10-CM | POA: Insufficient documentation

## 2016-04-25 DIAGNOSIS — M79601 Pain in right arm: Secondary | ICD-10-CM | POA: Diagnosis not present

## 2016-04-25 DIAGNOSIS — S59911A Unspecified injury of right forearm, initial encounter: Secondary | ICD-10-CM | POA: Diagnosis present

## 2016-04-25 DIAGNOSIS — N390 Urinary tract infection, site not specified: Secondary | ICD-10-CM | POA: Diagnosis not present

## 2016-04-25 HISTORY — DX: Unspecified dementia, unspecified severity, without behavioral disturbance, psychotic disturbance, mood disturbance, and anxiety: F03.90

## 2016-04-25 LAB — BASIC METABOLIC PANEL
ANION GAP: 8 (ref 5–15)
BUN: 31 mg/dL — ABNORMAL HIGH (ref 6–20)
CHLORIDE: 102 mmol/L (ref 101–111)
CO2: 27 mmol/L (ref 22–32)
Calcium: 9.2 mg/dL (ref 8.9–10.3)
Creatinine, Ser: 0.94 mg/dL (ref 0.61–1.24)
GFR calc Af Amer: >60 mL/min (ref >60)
GLUCOSE: 157 mg/dL — AB (ref 65–99)
POTASSIUM: 4.5 mmol/L (ref 3.5–5.1)
Sodium: 137 mmol/L (ref 135–145)

## 2016-04-25 LAB — CBC WITH DIFFERENTIAL/PLATELET
BASOS ABS: 0.1 10*3/uL (ref 0–0.1)
Basophils Relative: 1 %
EOS PCT: 1 %
Eosinophils Absolute: 0.1 10*3/uL (ref 0–0.7)
HEMATOCRIT: 40.6 % (ref 40.0–52.0)
HEMOGLOBIN: 13.4 g/dL (ref 13.0–18.0)
LYMPHS ABS: 2.9 10*3/uL (ref 1.0–3.6)
LYMPHS PCT: 25 %
MCH: 31.1 pg (ref 26.0–34.0)
MCHC: 33.1 g/dL (ref 32.0–36.0)
MCV: 94 fL (ref 80.0–100.0)
Monocytes Absolute: 0.9 10*3/uL (ref 0.2–1.0)
Monocytes Relative: 8 %
NEUTROS ABS: 7.8 10*3/uL — AB (ref 1.4–6.5)
NEUTROS PCT: 67 %
PLATELETS: 214 10*3/uL (ref 150–440)
RBC: 4.32 MIL/uL — AB (ref 4.40–5.90)
RDW: 13.8 % (ref 11.5–14.5)
WBC: 11.7 10*3/uL — ABNORMAL HIGH (ref 3.8–10.6)

## 2016-04-25 LAB — URINALYSIS, COMPLETE (UACMP) WITH MICROSCOPIC
BACTERIA UA: NONE SEEN
BILIRUBIN URINE: NEGATIVE
GLUCOSE, UA: NEGATIVE mg/dL
HGB URINE DIPSTICK: NEGATIVE
Ketones, ur: NEGATIVE mg/dL
NITRITE: NEGATIVE
PROTEIN: NEGATIVE mg/dL
SPECIFIC GRAVITY, URINE: 1.023 (ref 1.005–1.030)
pH: 5 (ref 5.0–8.0)

## 2016-04-25 LAB — TROPONIN I: Troponin I: <0.03 ng/mL (ref <0.03)

## 2016-04-25 MED ORDER — CEPHALEXIN 500 MG PO CAPS
500.0000 mg | ORAL_CAPSULE | Freq: Once | ORAL | Status: AC
  Administered 2016-04-25: 500 mg via ORAL
  Filled 2016-04-25: qty 1

## 2016-04-25 MED ORDER — CEPHALEXIN 500 MG PO CAPS: 500.0000 mg | ORAL_CAPSULE | Freq: Three times a day (TID) | ORAL | 0 refills | Status: DC

## 2016-04-25 NOTE — Discharge Instructions (Signed)
Please seek medical attention for any high fevers, chest pain, shortness of breath, change in behavior, persistent vomiting, bloody stool or any other new or concerning symptoms.  

## 2016-04-25 NOTE — ED Notes (Signed)
Pt. Going home with son. 

## 2016-04-25 NOTE — ED Triage Notes (Signed)
Per [facility], patient comes from home after a witnessed fall. Patient fell 2 feet off porch. Witnessed by family. No LOC. Patient denies any pain. Hx of prostate cancer, dementia and diabetes. CBG 124. Patient A&O x2 to self and place.

## 2016-04-25 NOTE — ED Notes (Signed)
Pt. Assisted with using urinal in bed.  Family in room with patient.

## 2016-04-25 NOTE — ED Notes (Addendum)
Patient now c/o right throbbing arm pain. Patient rubbing arm.

## 2016-04-25 NOTE — ED Provider Notes (Signed)
[facility] Emergency Department Provider Note  ____________________________________________   I have reviewed the triage vital signs and the nursing notes.   HISTORY  Chief Complaint Fall   History limited by: Dementia, some history obtained from family   HPI Albert Fritz is a 83 y.o. male who presents to the emergency department today after a fall. The patient was up on a porch when he got to stand up and he fell off of it. She roughly 2 feet off the ground. Family states the patient baseline has a difficult time getting up and walking. There is no report that the patient was complaining of any chest pain nor had they notice any recent shortness breath fevers or other illness. Patient did not have any loss of consciousness. He was able to ambulate with help back into the house. Family states they have noticed the patient complaining of a little bit of right arm pain. They're unsure if the patient hit his head or not.   Past Medical History:  Diagnosis Date  . Anxiety   . Arthritis   . BPH (benign prostatic hyperplasia)   . Cataract   . Dementia   . Diabetes mellitus without complication ([place])   . Elevated PSA   . Hearing loss   . History of kidney stones   . Hyperlipidemia   . Hypertension   . OA (osteoarthritis)   . Prostatitis   . Urinary retention   . UTI (lower urinary tract infection)     Patient Active Problem List   Diagnosis Date Noted  . Neck pain, bilateral 10/23/2015  . Fall 08/25/2015  . Pain in lower back 08/25/2015  . Hyponatremia 08/25/2015  . Financial difficulties 08/25/2015  . Prostate cancer metastatic to bone ([place]) 12/31/2014  . Elevated PSA 12/05/2014  . Urinary retention 12/05/2014  . BPH (benign prostatic hypertrophy) with urinary retention 12/05/2014  . Essential (primary) hypertension 07/11/2014  . Atrial flutter ([place]) 07/11/2014  . Hypertension     Past Surgical History:  Procedure Laterality Date  . BACK  SURGERY    . BACK SURGERY    . CATARACT EXTRACTION    . HERNIA REPAIR      Prior to Admission medications   Medication Sig Start Date End Date Taking? Authorizing Provider  acetaminophen (TYLENOL) 650 MG CR tablet Take 650 mg by mouth every 8 (eight) hours as needed for pain.    Historical Provider, MD  aspirin 81 MG chewable tablet Chew 81 mg by mouth 2 (two) times daily.     Historical Provider, MD  atorvastatin (LIPITOR) 10 MG tablet Take 10 mg by mouth daily.    Historical Provider, MD  glipiZIDE (GLUCOTROL) 5 MG tablet Take 5 mg by mouth daily before breakfast. Reported on 07/24/2015    Historical Provider, MD  Vitamin D, Ergocalciferol, (DRISDOL) 50000 units CAPS capsule Take 50,000 Units by mouth every 7 (seven) days.     Historical Provider, MD  XTANDI 40 MG capsule TAKE 4 CAPSULES (160 MG TOTAL) BY MOUTH ONCE DAILY AT THE SAME TIME. MAY TAKE WITH OR WITHOUT FOOD. SWALLOW WHOLE. 09/22/15   Cammie Sickle, MD    Allergies Patient has no known allergies.  Family History  Problem Relation Age of Onset  . Throat cancer Father     Social History Social History  Substance Use Topics  . Smoking status: Former Smoker    Types: Cigarettes  . Smokeless tobacco: Never Used     Comment: quit 30  years ago  . Alcohol use No    Review of Systems  Constitutional: Negative for fever. Cardiovascular: Negative for chest pain. Respiratory: Negative for shortness of breath. Gastrointestinal: Negative for abdominal pain, vomiting and diarrhea. Musculoskeletal: Positive for right arm pain.  10-point ROS otherwise negative.  ____________________________________________   PHYSICAL EXAM:  VITAL SIGNS: ED Triage Vitals  Enc Vitals Group     BP 04/25/16 1823 (!) 162/90     Pulse Rate 04/25/16 1823 85     Resp 04/25/16 1823 18     Temp 04/25/16 1823 97.6 F (36.4 C)     Temp Source 04/25/16 1823 Oral     SpO2 04/25/16 1823 98 %     Weight 04/25/16 1823 210 lb (95.3 kg)      Height 04/25/16 1823 6\' 2"  (1.88 m)     Head Circumference --      Peak Flow --      Pain Score 04/25/16 1827 8   Constitutional: Awake and alert.  Eyes: Conjunctivae are normal. Normal extraocular movements. ENT   Head: Normocephalic and atraumatic. PERRL. No hemotympanum.    Nose: No congestion/rhinnorhea.   Mouth/Throat: Mucous membranes are moist.   Neck: No stridor. No midline tenderness.  Hematological/Lymphatic/Immunilogical: No cervical lymphadenopathy. Cardiovascular: Normal rate, regular rhythm.  No murmurs, rubs, or gallops.  Respiratory: Normal respiratory effort without tachypnea nor retractions. Breath sounds are clear and equal bilaterally. No wheezes/rales/rhonchi. Gastrointestinal: Soft and non tender. No rebound. No guarding.  Genitourinary: Deferred Musculoskeletal: Normal range of motion in all extremities. No lower extremity edema. Neurologic:  Normal speech and language. No gross focal neurologic deficits are appreciated.  Skin:  Skin is warm, dry and intact. No rash noted. Psychiatric: Mood and affect are normal. Speech and behavior are normal. Patient exhibits appropriate insight and judgment.  ____________________________________________    LABS (pertinent positives/negatives)  Labs Reviewed  CBC WITH DIFFERENTIAL/PLATELET - Abnormal; Notable for the following:       Result Value   WBC 11.7 (*)    RBC 4.32 (*)    Neutro Abs 7.8 (*)    All other components within normal limits  BASIC METABOLIC PANEL - Abnormal; Notable for the following:    Glucose, Bld 157 (*)    BUN 31 (*)    All other components within normal limits  URINALYSIS, COMPLETE (UACMP) WITH MICROSCOPIC - Abnormal; Notable for the following:    Color, Urine YELLOW (*)    APPearance CLEAR (*)    Leukocytes, UA TRACE (*)    Squamous Epithelial / LPF 0-5 (*)    All other components within normal limits  URINE CULTURE  TROPONIN I      ____________________________________________   EKG  None  ____________________________________________    RADIOLOGY  CT head IMPRESSION:  No acute intracranial process.    Cortical atrophy and chronic microvascular ischemic changes.    Opacification of the left mastoid air cells.       ____________________________________________   PROCEDURES  Procedures  ____________________________________________   INITIAL IMPRESSION / ASSESSMENT AND PLAN / ED COURSE  Pertinent labs & imaging results that were available during my care of the patient were reviewed by me and considered in my medical decision making (see chart for details).  Patient presented to the emergency department today after a fall off a 2 foot porch. No traumatic injuries identified on exam. Patient's workup was concerning for possible urinary tract infection which family states patient has had in the past. Will plan on  giving first dose of antibiotics here in the emergency department. Will discharge home with further prescriptions.  ____________________________________________   FINAL CLINICAL IMPRESSION(S) / ED DIAGNOSES  Final diagnoses:  Lower urinary tract infectious disease     Note: This dictation was prepared with Dragon dictation. Any transcriptional errors that result from this process are unintentional     Nance Pear, MD 04/25/16 2134

## 2016-04-25 NOTE — ED Notes (Signed)
Pt. Moved from [facility] to room #4 to use bathroom.  Pt. States painful urination.  Pt. Was red and sore around groan.

## 2016-04-26 ENCOUNTER — Inpatient Hospital Stay: Payer: Medicare HMO

## 2016-04-26 ENCOUNTER — Inpatient Hospital Stay: Payer: Medicare HMO | Admitting: Internal Medicine

## 2016-04-27 LAB — URINE CULTURE

## 2016-05-02 IMAGING — CR DG ABDOMEN 1V
1 series · 3 of 3 positions shown · non-contrast
Comparison: None.

CLINICAL DATA: Nausea and constipation. Last bowel movement was
[REDACTED] morning.

EXAM:
ABDOMEN - 1 VIEW

[Series 1: dxr abdomen ap only · 0.14mm/px · 3 of 3 slices shown]
[im 1/3]
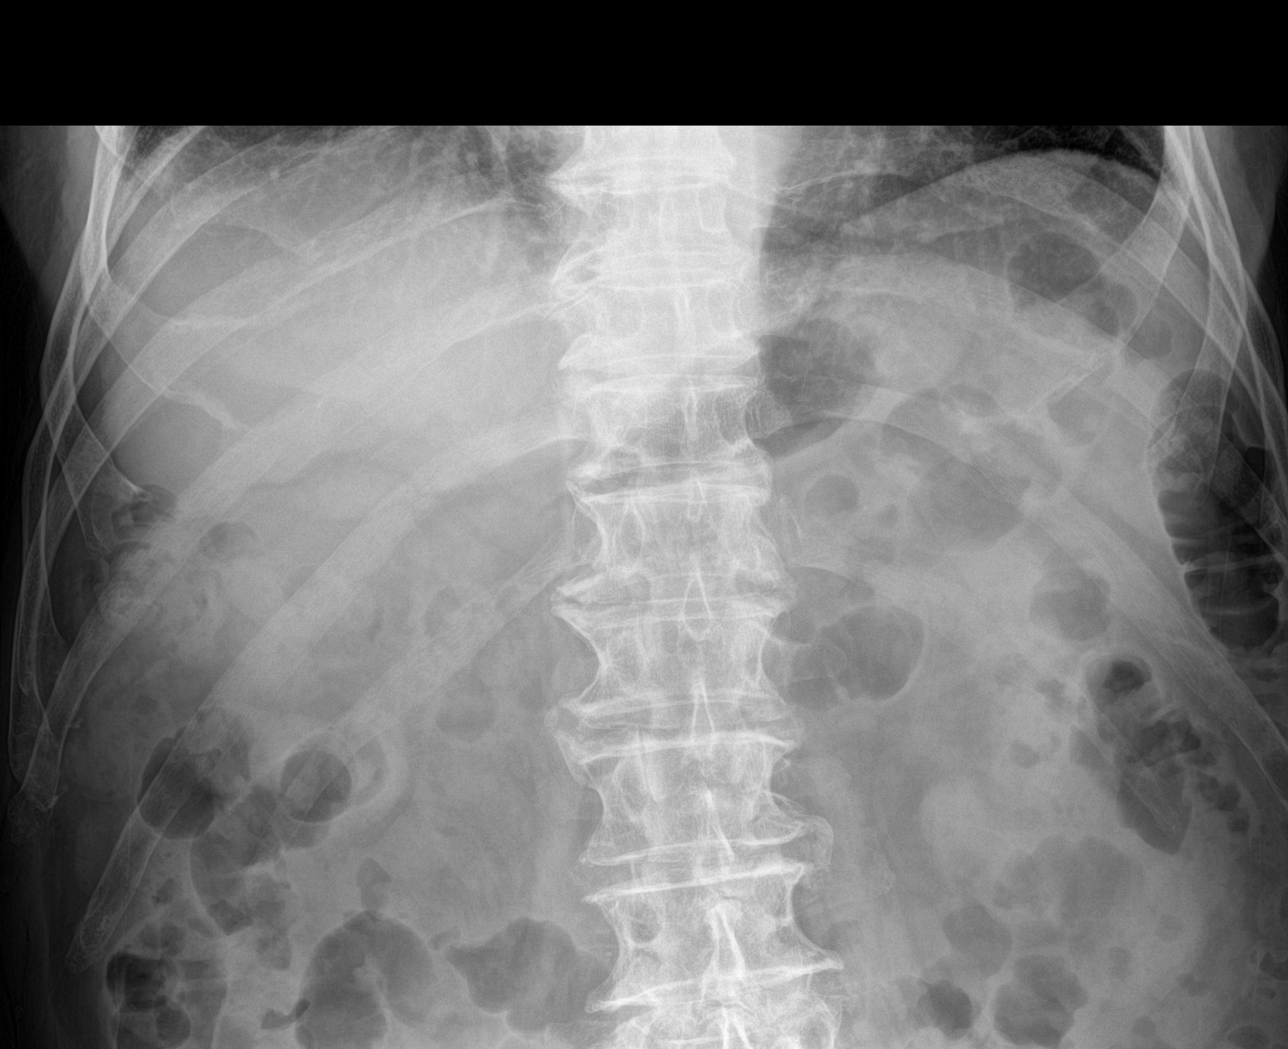
[im 2/3]
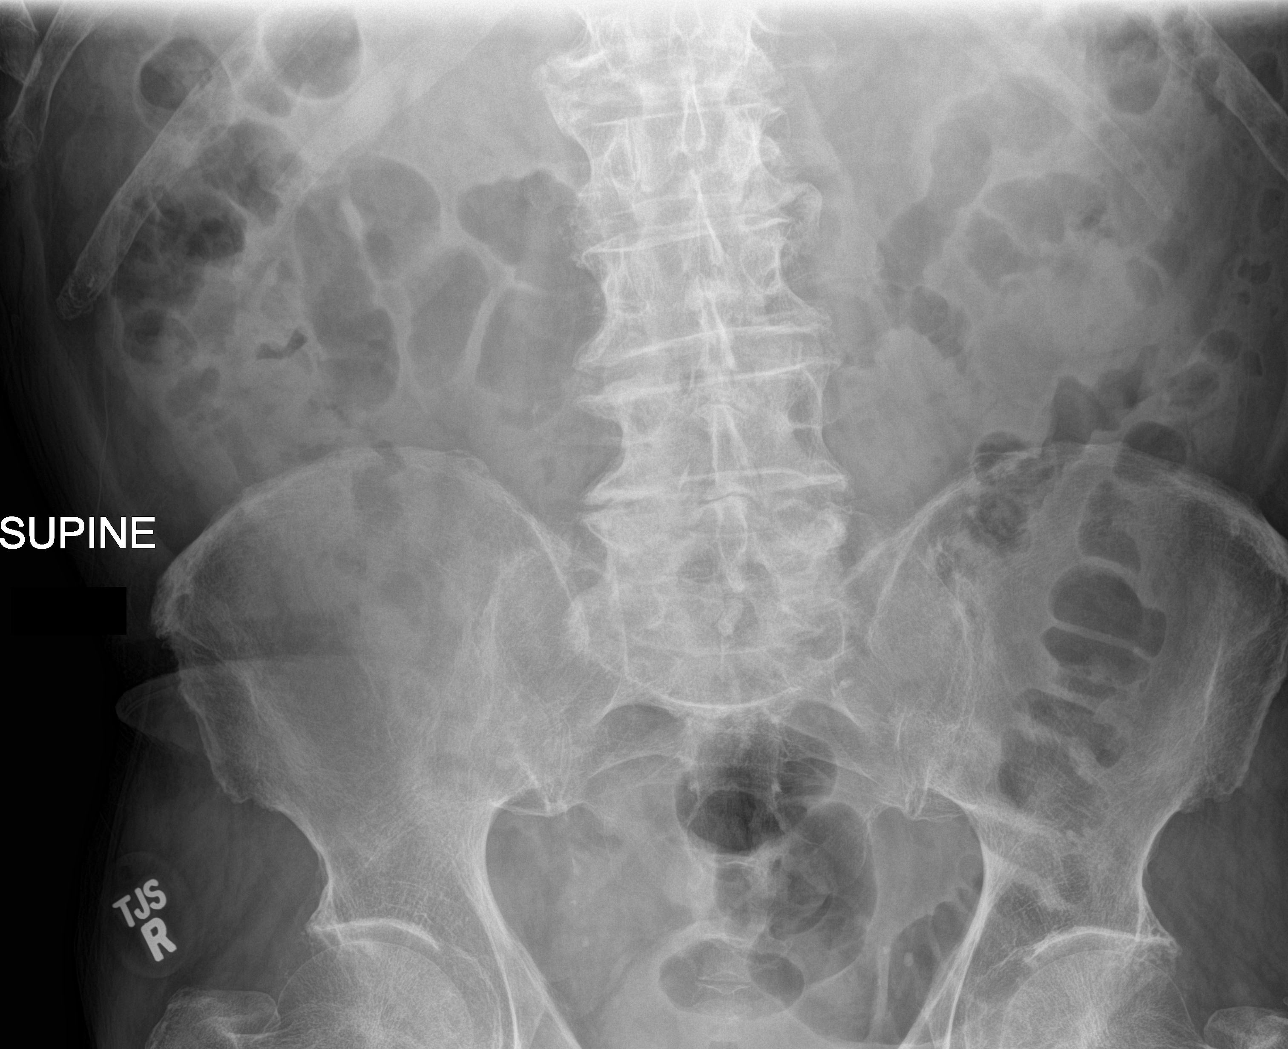
[im 3/3]
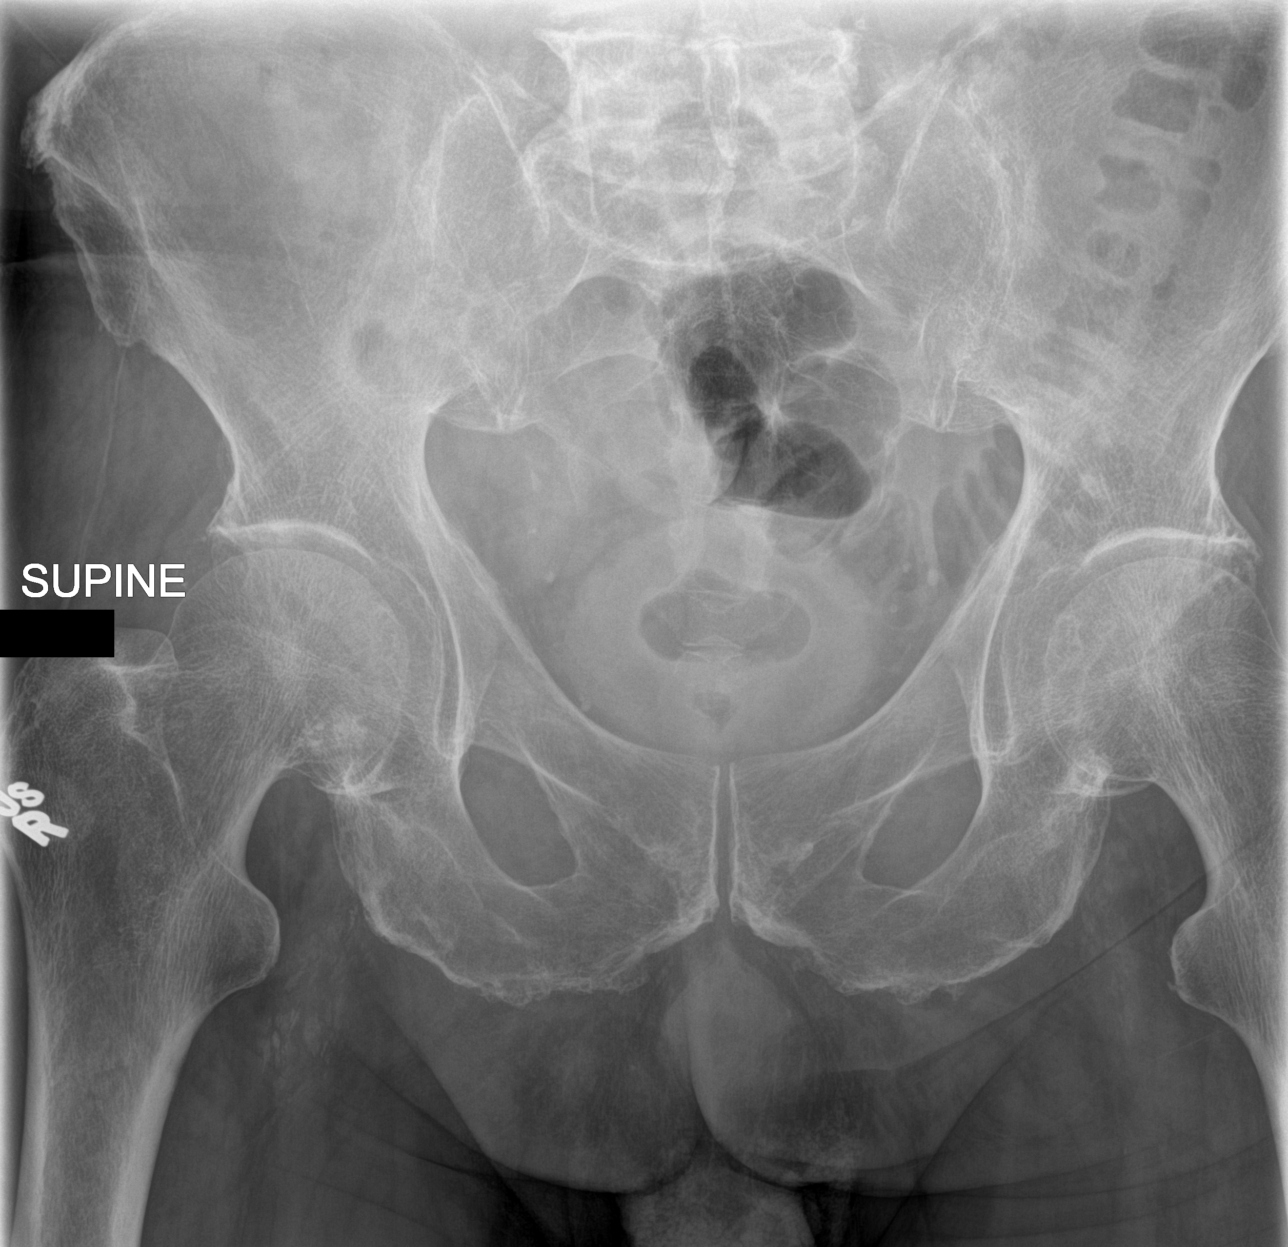

[3 of 3 positions shown; findings below may reference images not displayed]

FINDINGS: The bowel gas pattern is normal. No radio-opaque calculi or other
significant radiographic abnormality are seen.
IMPRESSION: Negative.

## 2016-05-10 ENCOUNTER — Inpatient Hospital Stay: Payer: Medicare HMO

## 2016-05-10 ENCOUNTER — Inpatient Hospital Stay: Payer: Medicare HMO | Attending: Internal Medicine | Admitting: Internal Medicine

## 2016-05-10 VITALS — BP 118/79 | HR 88 | Temp 97.2°F | Resp 18 | Wt 208.0 lb

## 2016-05-10 DIAGNOSIS — Z7984 Long term (current) use of oral hypoglycemic drugs: Secondary | ICD-10-CM | POA: Insufficient documentation

## 2016-05-10 DIAGNOSIS — I10 Essential (primary) hypertension: Secondary | ICD-10-CM | POA: Insufficient documentation

## 2016-05-10 DIAGNOSIS — M129 Arthropathy, unspecified: Secondary | ICD-10-CM | POA: Insufficient documentation

## 2016-05-10 DIAGNOSIS — C61 Malignant neoplasm of prostate: Secondary | ICD-10-CM | POA: Insufficient documentation

## 2016-05-10 DIAGNOSIS — R339 Retention of urine, unspecified: Secondary | ICD-10-CM | POA: Insufficient documentation

## 2016-05-10 DIAGNOSIS — Z800 Family history of malignant neoplasm of digestive organs: Secondary | ICD-10-CM | POA: Insufficient documentation

## 2016-05-10 DIAGNOSIS — Z79899 Other long term (current) drug therapy: Secondary | ICD-10-CM | POA: Diagnosis not present

## 2016-05-10 DIAGNOSIS — Z79818 Long term (current) use of other agents affecting estrogen receptors and estrogen levels: Secondary | ICD-10-CM | POA: Insufficient documentation

## 2016-05-10 DIAGNOSIS — Z87891 Personal history of nicotine dependence: Secondary | ICD-10-CM

## 2016-05-10 DIAGNOSIS — Z87442 Personal history of urinary calculi: Secondary | ICD-10-CM | POA: Diagnosis not present

## 2016-05-10 DIAGNOSIS — C7951 Secondary malignant neoplasm of bone: Principal | ICD-10-CM

## 2016-05-10 DIAGNOSIS — F419 Anxiety disorder, unspecified: Secondary | ICD-10-CM | POA: Diagnosis not present

## 2016-05-10 DIAGNOSIS — E785 Hyperlipidemia, unspecified: Secondary | ICD-10-CM | POA: Diagnosis not present

## 2016-05-10 DIAGNOSIS — E119 Type 2 diabetes mellitus without complications: Secondary | ICD-10-CM | POA: Insufficient documentation

## 2016-05-10 DIAGNOSIS — F0390 Unspecified dementia without behavioral disturbance: Secondary | ICD-10-CM | POA: Diagnosis not present

## 2016-05-10 DIAGNOSIS — M1990 Unspecified osteoarthritis, unspecified site: Secondary | ICD-10-CM | POA: Insufficient documentation

## 2016-05-10 DIAGNOSIS — Z7982 Long term (current) use of aspirin: Secondary | ICD-10-CM | POA: Diagnosis not present

## 2016-05-10 DIAGNOSIS — Z87440 Personal history of urinary (tract) infections: Secondary | ICD-10-CM | POA: Insufficient documentation

## 2016-05-10 LAB — CBC WITH DIFFERENTIAL/PLATELET
Basophils Absolute: 0 10*3/uL (ref 0–0.1)
Basophils Relative: 0 %
EOS ABS: 0.1 10*3/uL (ref 0–0.7)
EOS PCT: 1 %
HCT: 40.3 % (ref 40.0–52.0)
Hemoglobin: 13.8 g/dL (ref 13.0–18.0)
LYMPHS ABS: 3.1 10*3/uL (ref 1.0–3.6)
Lymphocytes Relative: 33 %
MCH: 31.6 pg (ref 26.0–34.0)
MCHC: 34.2 g/dL (ref 32.0–36.0)
MCV: 92.5 fL (ref 80.0–100.0)
MONO ABS: 0.8 10*3/uL (ref 0.2–1.0)
MONOS PCT: 8 %
Neutro Abs: 5.5 10*3/uL (ref 1.4–6.5)
Neutrophils Relative %: 58 %
PLATELETS: 251 10*3/uL (ref 150–440)
RBC: 4.36 MIL/uL — ABNORMAL LOW (ref 4.40–5.90)
RDW: 13.9 % (ref 11.5–14.5)
WBC: 9.5 10*3/uL (ref 3.8–10.6)

## 2016-05-10 LAB — COMPREHENSIVE METABOLIC PANEL
ALT: 20 U/L (ref 17–63)
AST: 27 U/L (ref 15–41)
Albumin: 3.9 g/dL (ref 3.5–5.0)
Alkaline Phosphatase: 65 U/L (ref 38–126)
Anion gap: 7 (ref 5–15)
BUN: 28 mg/dL — ABNORMAL HIGH (ref 6–20)
CHLORIDE: 107 mmol/L (ref 101–111)
CO2: 25 mmol/L (ref 22–32)
CREATININE: 0.88 mg/dL (ref 0.61–1.24)
Calcium: 9.1 mg/dL (ref 8.9–10.3)
Glucose, Bld: 129 mg/dL — ABNORMAL HIGH (ref 65–99)
POTASSIUM: 4.4 mmol/L (ref 3.5–5.1)
SODIUM: 139 mmol/L (ref 135–145)
Total Bilirubin: 1 mg/dL (ref 0.3–1.2)
Total Protein: 7.7 g/dL (ref 6.5–8.1)

## 2016-05-10 LAB — PSA: PSA: 6.22 ng/mL — AB (ref 0.00–4.00)

## 2016-05-10 MED ORDER — LEUPROLIDE ACETATE (3 MONTH) 22.5 MG IM KIT
22.5000 mg | PACK | Freq: Once | INTRAMUSCULAR | Status: AC
  Administered 2016-05-10: 22.5 mg via INTRAMUSCULAR
  Filled 2016-05-10: qty 22.5

## 2016-05-10 MED ORDER — DENOSUMAB 120 MG/1.7ML ~~LOC~~ SOLN
120.0000 mg | Freq: Once | SUBCUTANEOUS | Status: AC
  Administered 2016-05-10: 120 mg via SUBCUTANEOUS
  Filled 2016-05-10: qty 1.7

## 2016-05-10 NOTE — Progress Notes (Signed)
Patient here today for follow up.  Patient states no new concerns today  

## 2016-05-10 NOTE — Progress Notes (Signed)
[place] OFFICE PROGRESS NOTE  Patient Care Team: Albert Merles, MD as PCP - General (Family Medicine)  Prostate cancer metastatic to bone [facility])   Staging form: Prostate, AJCC 7th Edition     Clinical: Stage IV (T3b, N1, M1, PSA: 20 or greater) - Signed by Albert Gleason, MD on 01/13/2015    Oncology History   SEP 2016- METASTATIC PROSTATE CA to Bone/LN [PSA- [id number]; OCT 2016- ADT  # JAN-Feb 2017- CASTRATE RESISTANT PROSTATE CA; Albert Fritz M; Lupron q3M [May 23rd 2017]; PSA- 1200  [June 2017]; July 1st 2017Buddy Fritz X-tandi      Prostate cancer metastatic to bone [facility])   12/31/2014 Initial Diagnosis    Prostate cancer metastatic to bone [facility])        INTERVAL HISTORY:  Albert Fritz 83 y.o.  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 Patient has been on androgen deprivation therapy since October 2016. Patient currently on Xtandi since 09/06/2015.   Unfortunately patient had recent fall mechanical. No major trauma.   He otherwise overall feeling well. He is being more active. Appetite improving. No weight loss. No chest pain or shortness of breath or cough. No significant fatigue  No nausea no vomiting. No chest pain or shortness of breath. No bone pain. No jaw pain.  REVIEW OF SYSTEMS:  A complete 10 point review of system is done which is negative except mentioned above/history of present illness.   PAST MEDICAL HISTORY :  Past Medical History:  Diagnosis Date  . Anxiety   . Arthritis   . BPH (benign prostatic hyperplasia)   . Cataract   . Dementia   . Diabetes mellitus without complication ([place])   . Elevated PSA   . Hearing loss   . History of kidney stones   . Hyperlipidemia   . Hypertension   . OA (osteoarthritis)   . Prostatitis   . Urinary retention   . UTI (lower urinary tract infection)     PAST SURGICAL HISTORY :   Past Surgical History:  Procedure Laterality Date  . BACK  SURGERY    . BACK SURGERY    . CATARACT EXTRACTION    . HERNIA REPAIR      FAMILY HISTORY :   Family History  Problem Relation Age of Onset  . Throat cancer Father     SOCIAL HISTORY:   Social History  Substance Use Topics  . Smoking status: Former Smoker    Types: Cigarettes  . Smokeless tobacco: Never Used     Comment: quit 30 years ago  . Alcohol use No    ALLERGIES:  has No Known Allergies.  MEDICATIONS:  Current Outpatient Prescriptions  Medication Sig Dispense Refill  . acetaminophen (TYLENOL) 650 MG CR tablet Take 650 mg by mouth every 8 (eight) hours as needed for pain.    Marland Kitchen aspirin 81 MG chewable tablet Chew 81 mg by mouth 2 (two) times daily.     Marland Kitchen atorvastatin (LIPITOR) 10 MG tablet Take 10 mg by mouth daily.    Marland Kitchen glipiZIDE (GLUCOTROL) 5 MG tablet Take 5 mg by mouth daily before breakfast. Reported on 07/24/2015    . Vitamin D, Ergocalciferol, (DRISDOL) 50000 units CAPS capsule Take 50,000 Units by mouth every 7 (seven) days.     Albert Fritz 40 MG capsule TAKE 4 CAPSULES (160 MG TOTAL) BY MOUTH ONCE DAILY AT THE SAME TIME. MAY TAKE WITH OR WITHOUT FOOD. SWALLOW WHOLE.  120 capsule 12   No current facility-administered medications for this visit.     PHYSICAL EXAMINATION: ECOG PERFORMANCE STATUS: 2 - Symptomatic, <50% confined to bed  BP 118/79 (BP Location: Left Arm, Patient Position: Sitting)   Pulse 88   Temp 97.2 F (36.2 C) (Tympanic)   Resp 18   Wt 208 lb (94.3 kg)   BMI 26.71 kg/m   Filed Weights   05/10/16 1412  Weight: 208 lb (94.3 kg)    GENERAL: Well-nourished well-developed; Alert, no distress and comfortable.  Accompanied by his son. Patient is wheelchair. EYES: no pallor or icterus OROPHARYNX: no thrush or ulceration; good dentition  NECK: supple, no masses felt LYMPH:  no palpable lymphadenopathy in the cervical, axillary or inguinal regions LUNGS: clear to auscultation and  No wheeze or crackles HEART/CVS: regular rate & rhythm and no  murmurs; No lower extremity edema ABDOMEN:abdomen soft, non-tender and normal bowel sounds Musculoskeletal:no cyanosis of digits and no clubbing  PSYCH: alert & oriented x 3 with fluent speech NEURO: no focal motor/sensory deficits SKIN:  no rashes or significant lesions  LABORATORY DATA:  I have reviewed the data as listed    Component Value Date/Time   NA 139 05/10/2016 1340   K 4.4 05/10/2016 1340   CL 107 05/10/2016 1340   CO2 25 05/10/2016 1340   GLUCOSE 129 (H) 05/10/2016 1340   BUN 28 (H) 05/10/2016 1340   CREATININE 0.88 05/10/2016 1340   CALCIUM 9.1 05/10/2016 1340   PROT 7.7 05/10/2016 1340   ALBUMIN 3.9 05/10/2016 1340   AST 27 05/10/2016 1340   ALT 20 05/10/2016 1340   ALKPHOS 65 05/10/2016 1340   BILITOT 1.0 05/10/2016 1340   GFRNONAA >60 05/10/2016 1340   GFRAA >60 05/10/2016 1340    No results found for: SPEP, UPEP  Lab Results  Component Value Date   WBC 9.5 05/10/2016   NEUTROABS 5.5 05/10/2016   HGB 13.8 05/10/2016   HCT 40.3 05/10/2016   MCV 92.5 05/10/2016   PLT 251 05/10/2016      Chemistry      Component Value Date/Time   NA 139 05/10/2016 1340   K 4.4 05/10/2016 1340   CL 107 05/10/2016 1340   CO2 25 05/10/2016 1340   BUN 28 (H) 05/10/2016 1340   CREATININE 0.88 05/10/2016 1340      Component Value Date/Time   CALCIUM 9.1 05/10/2016 1340   ALKPHOS 65 05/10/2016 1340   AST 27 05/10/2016 1340   ALT 20 05/10/2016 1340   BILITOT 1.0 05/10/2016 1340     Results for Albert Fritz, Albert Fritz (MRN [id number]) as of 01/26/2016 12:01  Ref. Range 08/25/2015 10:15 09/22/2015 10:50 10/23/2015 09:35 11/24/2015 11:07 12/22/2015 09:50  PSA Latest Ref Range: 0.00 - 4.00 ng/mL 1,024.00 (H) 9.36 (H) 0.12 0.03 0.06    RADIOGRAPHIC STUDIES: I have personally reviewed the radiological images as listed and agreed with the findings in the report. No results found.   ASSESSMENT & PLAN:  Prostate cancer metastatic to bone [facility]) Metastatic Castrate resistant  prostate cancer; stage IV  [ mets to the bone lymph nodes. On X-tandi [July 1st 2017] with lupron q 3 M [last in Nov 2017]. Tolerating well X-tandi. PSA 1000- to 0.55 [ Dec 2017]. Significant PSA response  # Multifactorial chronic arthritis/metastatic prostate cancer. Improved;  Continue X-geva. Calcium 9.2 Calcium and vitamin D. No jaw pain.  # Recent fall- not seizures. Monitor for now.   # follow up in 6 week/ shots  X-geva/labs/psa.   Orders Placed This Encounter  Procedures  . CBC with Differential    Standing Status:   Future    Standing Expiration Date:   05/10/2017  . Comprehensive metabolic panel    Standing Status:   Future    Standing Expiration Date:   05/10/2017  . PSA    Standing Status:   Future    Standing Expiration Date:   05/10/2017   All questions were answered. The patient knows to call the clinic with any problems, questions or concerns.     Cammie Sickle, MD 05/10/2016 4:30 PM

## 2016-05-10 NOTE — Assessment & Plan Note (Addendum)
Metastatic Castrate resistant prostate cancer; stage IV  [ mets to the bone lymph nodes. On X-tandi [July 1st 2017] with lupron q 3 M [last in Nov 2017]. Tolerating well X-tandi. PSA 1000- to 0.55 [ Dec 2017]. Significant PSA response  # Multifactorial chronic arthritis/metastatic prostate cancer. Improved;  Continue X-geva. Calcium 9.2 Calcium and vitamin D. No jaw pain.  # Recent fall- not seizures. Monitor for now.   # follow up in 6 week/ shots  X-geva/labs/psa.

## 2016-05-25 NOTE — Progress Notes (Unsigned)
PSN assistiing patient with application for free Lupron since it is cost prohibited to the patient.

## 2016-06-21 ENCOUNTER — Inpatient Hospital Stay: Payer: Medicare HMO

## 2016-06-21 ENCOUNTER — Inpatient Hospital Stay: Payer: Medicare HMO | Attending: Internal Medicine | Admitting: Internal Medicine

## 2016-06-21 VITALS — BP 122/81 | HR 91 | Temp 97.8°F | Resp 20 | Ht 74.0 in | Wt 210.0 lb

## 2016-06-21 DIAGNOSIS — I10 Essential (primary) hypertension: Secondary | ICD-10-CM | POA: Diagnosis not present

## 2016-06-21 DIAGNOSIS — Z7984 Long term (current) use of oral hypoglycemic drugs: Secondary | ICD-10-CM

## 2016-06-21 DIAGNOSIS — Z7982 Long term (current) use of aspirin: Secondary | ICD-10-CM | POA: Insufficient documentation

## 2016-06-21 DIAGNOSIS — Z79899 Other long term (current) drug therapy: Secondary | ICD-10-CM | POA: Diagnosis not present

## 2016-06-21 DIAGNOSIS — C7951 Secondary malignant neoplasm of bone: Secondary | ICD-10-CM | POA: Diagnosis not present

## 2016-06-21 DIAGNOSIS — M25512 Pain in left shoulder: Secondary | ICD-10-CM | POA: Diagnosis not present

## 2016-06-21 DIAGNOSIS — E119 Type 2 diabetes mellitus without complications: Secondary | ICD-10-CM | POA: Insufficient documentation

## 2016-06-21 DIAGNOSIS — N400 Enlarged prostate without lower urinary tract symptoms: Secondary | ICD-10-CM

## 2016-06-21 DIAGNOSIS — F419 Anxiety disorder, unspecified: Secondary | ICD-10-CM | POA: Diagnosis not present

## 2016-06-21 DIAGNOSIS — Z87440 Personal history of urinary (tract) infections: Secondary | ICD-10-CM | POA: Insufficient documentation

## 2016-06-21 DIAGNOSIS — R5383 Other fatigue: Secondary | ICD-10-CM | POA: Insufficient documentation

## 2016-06-21 DIAGNOSIS — M25511 Pain in right shoulder: Secondary | ICD-10-CM | POA: Diagnosis not present

## 2016-06-21 DIAGNOSIS — C61 Malignant neoplasm of prostate: Secondary | ICD-10-CM | POA: Diagnosis present

## 2016-06-21 DIAGNOSIS — Z87442 Personal history of urinary calculi: Secondary | ICD-10-CM | POA: Insufficient documentation

## 2016-06-21 DIAGNOSIS — M1990 Unspecified osteoarthritis, unspecified site: Secondary | ICD-10-CM | POA: Insufficient documentation

## 2016-06-21 DIAGNOSIS — F0390 Unspecified dementia without behavioral disturbance: Secondary | ICD-10-CM | POA: Diagnosis not present

## 2016-06-21 DIAGNOSIS — Z87891 Personal history of nicotine dependence: Secondary | ICD-10-CM | POA: Insufficient documentation

## 2016-06-21 DIAGNOSIS — E785 Hyperlipidemia, unspecified: Secondary | ICD-10-CM | POA: Insufficient documentation

## 2016-06-21 DIAGNOSIS — M129 Arthropathy, unspecified: Secondary | ICD-10-CM | POA: Diagnosis not present

## 2016-06-21 DIAGNOSIS — R634 Abnormal weight loss: Secondary | ICD-10-CM | POA: Diagnosis not present

## 2016-06-21 DIAGNOSIS — C779 Secondary and unspecified malignant neoplasm of lymph node, unspecified: Secondary | ICD-10-CM | POA: Insufficient documentation

## 2016-06-21 LAB — CBC WITH DIFFERENTIAL/PLATELET
BASOS PCT: 0 %
Basophils Absolute: 0 10*3/uL (ref 0–0.1)
Eosinophils Absolute: 0.1 10*3/uL (ref 0–0.7)
Eosinophils Relative: 1 %
HEMATOCRIT: 40.8 % (ref 40.0–52.0)
HEMOGLOBIN: 13.9 g/dL (ref 13.0–18.0)
LYMPHS ABS: 3.2 10*3/uL (ref 1.0–3.6)
Lymphocytes Relative: 35 %
MCH: 31.4 pg (ref 26.0–34.0)
MCHC: 34.2 g/dL (ref 32.0–36.0)
MCV: 91.8 fL (ref 80.0–100.0)
MONO ABS: 0.7 10*3/uL (ref 0.2–1.0)
MONOS PCT: 8 %
NEUTROS ABS: 5 10*3/uL (ref 1.4–6.5)
NEUTROS PCT: 56 %
Platelets: 265 10*3/uL (ref 150–440)
RBC: 4.44 MIL/uL (ref 4.40–5.90)
RDW: 13.8 % (ref 11.5–14.5)
WBC: 9 10*3/uL (ref 3.8–10.6)

## 2016-06-21 LAB — COMPREHENSIVE METABOLIC PANEL
ALBUMIN: 3.9 g/dL (ref 3.5–5.0)
ALK PHOS: 55 U/L (ref 38–126)
ALT: 16 U/L — ABNORMAL LOW (ref 17–63)
ANION GAP: 5 (ref 5–15)
AST: 26 U/L (ref 15–41)
BILIRUBIN TOTAL: 1 mg/dL (ref 0.3–1.2)
BUN: 28 mg/dL — ABNORMAL HIGH (ref 6–20)
CALCIUM: 9 mg/dL (ref 8.9–10.3)
CO2: 29 mmol/L (ref 22–32)
Chloride: 102 mmol/L (ref 101–111)
Creatinine, Ser: 0.84 mg/dL (ref 0.61–1.24)
GFR calc Af Amer: >60 mL/min (ref >60)
GLUCOSE: 162 mg/dL — AB (ref 65–99)
Potassium: 4.3 mmol/L (ref 3.5–5.1)
Sodium: 136 mmol/L (ref 135–145)
TOTAL PROTEIN: 7.3 g/dL (ref 6.5–8.1)

## 2016-06-21 LAB — PSA: PSA: 14 ng/mL — AB (ref 0.00–4.00)

## 2016-06-21 MED ORDER — TRAMADOL HCL 50 MG PO TABS: 50.0000 mg | ORAL_TABLET | Freq: Three times a day (TID) | ORAL | 0 refills | Status: DC | PRN

## 2016-06-21 MED ORDER — DENOSUMAB 120 MG/1.7ML ~~LOC~~ SOLN
120.0000 mg | Freq: Once | SUBCUTANEOUS | Status: AC
  Administered 2016-06-21: 120 mg via SUBCUTANEOUS
  Filled 2016-06-21: qty 1.7

## 2016-06-21 NOTE — Progress Notes (Signed)
[place] OFFICE PROGRESS NOTE  Patient Care Team: Albert Merles, MD as PCP - General (Family Medicine)  Prostate cancer metastatic to bone [facility])   Staging form: Prostate, AJCC 7th Edition     Clinical: Stage IV (T3b, N1, M1, PSA: 20 or greater) - Signed by Albert Gleason, MD on 01/13/2015    Oncology History   SEP 2016- METASTATIC PROSTATE CA to Bone/LN [PSA- 124]; OCT 2016- ADT  # JAN-Feb 2017- CASTRATE RESISTANT PROSTATE CA; Albert Fritz M; Lupron q3M [May 23rd 2017]; PSA- 1200  [June 2017]; July 1st 2017Buddy Fritz X-tandi      Prostate cancer metastatic to bone [facility])   12/31/2014 Initial Diagnosis    Prostate cancer metastatic to bone [facility])        INTERVAL HISTORY:  Albert Fritz 83 y.o.  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   Patient complains of increasing pain in the bilateral shoulder areas. He denies any falls/trauma. Complains of significant fatigue over the last few weeks. Lost some weight. As per the son patient has been more confused intermittently -is concerned about Dementia.   No chest pain or shortness of breath or cough. No significant fatigue  No nausea no vomiting. No chest pain or shortness of breath. No jaw pain.  REVIEW OF SYSTEMS:  A complete 10 point review of system is done which is negative except mentioned above/history of present illness.   PAST MEDICAL HISTORY :  Past Medical History:  Diagnosis Date  . Anxiety   . Arthritis   . BPH (benign prostatic hyperplasia)   . Cataract   . Dementia   . Diabetes mellitus without complication ([place])   . Elevated PSA   . Hearing loss   . History of kidney stones   . Hyperlipidemia   . Hypertension   . OA (osteoarthritis)   . Prostatitis   . Urinary retention   . UTI (lower urinary tract infection)     PAST SURGICAL HISTORY :   Past Surgical History:  Procedure Laterality Date   . BACK SURGERY    . BACK SURGERY    . CATARACT EXTRACTION    . HERNIA REPAIR      FAMILY HISTORY :   Family History  Problem Relation Age of Onset  . Throat cancer Father     SOCIAL HISTORY:   Social History  Substance Use Topics  . Smoking status: Former Smoker    Types: Cigarettes  . Smokeless tobacco: Never Used     Comment: quit 30 years ago  . Alcohol use No    ALLERGIES:  has No Known Allergies.  MEDICATIONS:  Current Outpatient Prescriptions  Medication Sig Dispense Refill  . acetaminophen (TYLENOL) 650 MG CR tablet Take 650 mg by mouth Fritz 8 (eight) hours as needed for pain.    Marland Kitchen aspirin 81 MG chewable tablet Chew 81 mg by mouth 2 (two) times daily.     Marland Kitchen atorvastatin (LIPITOR) 10 MG tablet Take 10 mg by mouth daily.    Marland Kitchen glipiZIDE (GLUCOTROL) 5 MG tablet Take 5 mg by mouth daily before breakfast. Reported on 07/24/2015    . Vitamin D, Ergocalciferol, (DRISDOL) 50000 units CAPS capsule Take 50,000 Units by mouth Fritz 7 (seven) days.     Albert Fritz 40 MG capsule TAKE 4 CAPSULES (160 MG TOTAL) BY MOUTH ONCE DAILY AT THE SAME TIME. MAY TAKE WITH OR WITHOUT FOOD. SWALLOW  WHOLE. 120 capsule 12  . traMADol (ULTRAM) 50 MG tablet Take 1 tablet (50 mg total) by mouth Fritz 8 (eight) hours as needed. 90 tablet 0   No current facility-administered medications for this visit.     PHYSICAL EXAMINATION: ECOG PERFORMANCE STATUS: 2 - Symptomatic, <50% confined to bed  BP 122/81 (Patient Position: Sitting)   Pulse 91   Temp 97.8 F (36.6 C) (Tympanic)   Resp 20   Ht 6\' 2"  (1.88 m)   Wt 210 lb (95.3 kg)   BMI 26.96 kg/m   Filed Weights   06/21/16 1449  Weight: 210 lb (95.3 kg)    GENERAL: Well-nourished well-developed; Alert, no distress and comfortable.  Accompanied by his son. Patient is wheelchair. EYES: no pallor or icterus OROPHARYNX: no thrush or ulceration; good dentition  NECK: supple, no masses felt LYMPH:  no palpable lymphadenopathy in the cervical,  axillary or inguinal regions LUNGS: clear to auscultation and  No wheeze or crackles HEART/CVS: regular rate & rhythm and no murmurs; No lower extremity edema ABDOMEN:abdomen soft, non-tender and normal bowel sounds Musculoskeletal:no cyanosis of digits and no clubbing  PSYCH: alert & oriented x 3 with fluent speech NEURO: no focal motor/sensory deficits SKIN:  no rashes or significant lesions  LABORATORY DATA:  I have reviewed the data as listed    Component Value Date/Time   NA 136 06/21/2016 1420   K 4.3 06/21/2016 1420   CL 102 06/21/2016 1420   CO2 29 06/21/2016 1420   GLUCOSE 162 (H) 06/21/2016 1420   BUN 28 (H) 06/21/2016 1420   CREATININE 0.84 06/21/2016 1420   CALCIUM 9.0 06/21/2016 1420   PROT 7.3 06/21/2016 1420   ALBUMIN 3.9 06/21/2016 1420   AST 26 06/21/2016 1420   ALT 16 (L) 06/21/2016 1420   ALKPHOS 55 06/21/2016 1420   BILITOT 1.0 06/21/2016 1420   GFRNONAA >60 06/21/2016 1420   GFRAA >60 06/21/2016 1420    No results found for: SPEP, UPEP  Lab Results  Component Value Date   WBC 9.0 06/21/2016   NEUTROABS 5.0 06/21/2016   HGB 13.9 06/21/2016   HCT 40.8 06/21/2016   MCV 91.8 06/21/2016   PLT 265 06/21/2016      Chemistry      Component Value Date/Time   NA 136 06/21/2016 1420   K 4.3 06/21/2016 1420   CL 102 06/21/2016 1420   CO2 29 06/21/2016 1420   BUN 28 (H) 06/21/2016 1420   CREATININE 0.84 06/21/2016 1420      Component Value Date/Time   CALCIUM 9.0 06/21/2016 1420   ALKPHOS 55 06/21/2016 1420   AST 26 06/21/2016 1420   ALT 16 (L) 06/21/2016 1420   BILITOT 1.0 06/21/2016 1420     Results for Albert Fritz (MRN [id number]) as of 01/26/2016 12:01  Ref. Range 08/25/2015 10:15 09/22/2015 10:50 10/23/2015 09:35 11/24/2015 11:07 12/22/2015 09:50  PSA Latest Ref Range: 0.00 - 4.00 ng/mL 1,024.00 (H) 9.36 (H) 0.12 0.03 0.06    RADIOGRAPHIC STUDIES: I have personally reviewed the radiological images as listed and agreed with the findings in  the report. No results found.   ASSESSMENT & PLAN:  Prostate cancer metastatic to bone [facility]) Metastatic Castrate resistant prostate cancer; stage IV  [ mets to the bone lymph nodes. On X-tandi [July 1st 2017] with lupron q 3 M. PSA 1000- to 0.55; However, PSA- march 2018- 6. Progression noted with increasing PSA.  # Ongoing fatigue-progression of disease versus side effect of Xtandi.  Recommend holding Xtandi for now. Check CT of the chest abdomen pelvis with contrast and also a bone scan.  #Multifactorial chronic arthritis/metastatic prostate cancer-worsened;  Recommend tramadol; new script given.   Continue X-geva. Calcium 9.2 Calcium and vitamin D. No jaw pain.  # Dementia/delirium- monitor for now; this could definitely impact patient's treatment options/management going forward.  # follow up in 4 week/ labs scans prior.   Orders Placed This Encounter  Procedures  . CT ABDOMEN PELVIS W CONTRAST    Standing Status:   Future    Standing Expiration Date:   09/20/2017    Order Specific Question:   Reason for Exam (SYMPTOM  OR DIAGNOSIS REQUIRED)    Answer:   prostate cancer    Order Specific Question:   Preferred imaging location?    Answer:   [facility]  . CT CHEST W CONTRAST    Standing Status:   Future    Standing Expiration Date:   08/21/2017    Order Specific Question:   Reason for Exam (SYMPTOM  OR DIAGNOSIS REQUIRED)    Answer:   prostate cancer    Order Specific Question:   Preferred imaging location?    Answer:   [facility]  . NM Bone Scan Whole Body    Standing Status:   Future    Standing Expiration Date:   08/21/2017    Order Specific Question:   Reason for Exam (SYMPTOM  OR DIAGNOSIS REQUIRED)    Answer:   prostate cancer    Order Specific Question:   Preferred imaging location?    Answer:   [facility]  . CBC with Differential    Standing Status:   Future    Standing Expiration Date:   06/21/2017  . Comprehensive metabolic panel    Standing  Status:   Future    Standing Expiration Date:   06/21/2017  . PSA    Standing Status:   Future    Standing Expiration Date:   06/21/2017   All questions were answered. The patient knows to call the clinic with any problems, questions or concerns.     Cammie Sickle, MD 06/22/2016 7:55 AM

## 2016-06-21 NOTE — Progress Notes (Signed)
Patient here for metastatic prostate cancer follow-up. He is taking xstandi as directed. Has not missed any dosing.

## 2016-06-21 NOTE — Assessment & Plan Note (Addendum)
Metastatic Castrate resistant prostate cancer; stage IV  [ mets to the bone lymph nodes. On X-tandi [July 1st 2017] with lupron q 3 M. PSA 1000- to 0.55; However, PSA- march 2018- 6. Progression noted with increasing PSA.  # Ongoing fatigue-progression of disease versus side effect of Xtandi. Recommend holding Xtandi for now. Check CT of the chest abdomen pelvis with contrast and also a bone scan.  #Multifactorial chronic arthritis/metastatic prostate cancer-worsened;  Recommend tramadol; new script given.   Continue X-geva. Calcium 9.2 Calcium and vitamin D. No jaw pain.  # Dementia/delirium- monitor for now; this could definitely impact patient's treatment options/management going forward.  # follow up in 4 week/ labs scans prior.

## 2016-07-13 ENCOUNTER — Telehealth: Payer: Self-pay | Admitting: *Deleted

## 2016-07-13 ENCOUNTER — Ambulatory Visit
Admission: RE | Admit: 2016-07-13 | Discharge: 2016-07-13 | Disposition: A | Discharge status: Home / Self Care | Payer: Medicare HMO | Source: Ambulatory Visit | Attending: Internal Medicine | Admitting: Internal Medicine

## 2016-07-13 DIAGNOSIS — C7951 Secondary malignant neoplasm of bone: Principal | ICD-10-CM

## 2016-07-13 DIAGNOSIS — C61 Malignant neoplasm of prostate: Secondary | ICD-10-CM

## 2016-07-13 DIAGNOSIS — K7460 Unspecified cirrhosis of liver: Secondary | ICD-10-CM | POA: Diagnosis not present

## 2016-07-13 DIAGNOSIS — I700 Atherosclerosis of aorta: Secondary | ICD-10-CM | POA: Insufficient documentation

## 2016-07-13 DIAGNOSIS — K8020 Calculus of gallbladder without cholecystitis without obstruction: Secondary | ICD-10-CM | POA: Insufficient documentation

## 2016-07-13 DIAGNOSIS — I2510 Atherosclerotic heart disease of native coronary artery without angina pectoris: Secondary | ICD-10-CM | POA: Insufficient documentation

## 2016-07-13 DIAGNOSIS — K429 Umbilical hernia without obstruction or gangrene: Secondary | ICD-10-CM | POA: Diagnosis not present

## 2016-07-13 HISTORY — DX: Malignant (primary) neoplasm, unspecified: C80.1

## 2016-07-13 MED ORDER — IOPAMIDOL (ISOVUE-300) INJECTION 61%
100.0000 mL | Freq: Once | INTRAVENOUS | Status: AC | PRN
  Administered 2016-07-13: 100 mL via INTRAVENOUS

## 2016-07-13 NOTE — Telephone Encounter (Signed)
Colette, please make sure the Bone Scan is rescheduled.

## 2016-07-13 NOTE — Telephone Encounter (Signed)
Per Dr. Bradd Canary the patient's lab/md/xgeva apts needs to be changed to a few days after bone scan. MD needs bone scan results before these apts.  Colette, please r/s this apt and let pt's son know. Thanks.

## 2016-07-13 NOTE — Telephone Encounter (Signed)
Unable to do Bone Scan today, Son and patient were unaware of appt for this and he arrived too late to get his injection, He will have to be rescheduled and son states he cannot bring him back this week. He is getting his CT today. He has a lab/ md/ xgeva appt on Monday, but the Bone scan will not be done until some time next week.

## 2016-07-19 ENCOUNTER — Encounter
Admission: RE | Admit: 2016-07-19 | Discharge: 2016-07-19 | Disposition: A | Discharge status: Home / Self Care | Payer: Medicare HMO | Source: Ambulatory Visit | Attending: Internal Medicine | Admitting: Internal Medicine

## 2016-07-19 ENCOUNTER — Ambulatory Visit: Payer: Medicare HMO

## 2016-07-19 ENCOUNTER — Other Ambulatory Visit: Payer: Medicare HMO

## 2016-07-19 ENCOUNTER — Ambulatory Visit: Payer: Medicare HMO | Admitting: Internal Medicine

## 2016-07-19 DIAGNOSIS — C7951 Secondary malignant neoplasm of bone: Secondary | ICD-10-CM | POA: Diagnosis not present

## 2016-07-19 DIAGNOSIS — C61 Malignant neoplasm of prostate: Secondary | ICD-10-CM | POA: Diagnosis not present

## 2016-07-19 MED ORDER — TECHNETIUM TC 99M MEDRONATE IV KIT
23.2800 | PACK | Freq: Once | INTRAVENOUS | Status: AC | PRN
  Administered 2016-07-19: 23.28 via INTRAVENOUS

## 2016-07-21 ENCOUNTER — Inpatient Hospital Stay: Payer: Medicare HMO | Attending: Internal Medicine

## 2016-07-21 ENCOUNTER — Inpatient Hospital Stay (HOSPITAL_BASED_OUTPATIENT_CLINIC_OR_DEPARTMENT_OTHER): Payer: Medicare HMO | Admitting: Internal Medicine

## 2016-07-21 ENCOUNTER — Inpatient Hospital Stay: Payer: Medicare HMO

## 2016-07-21 VITALS — BP 121/72 | HR 53 | Temp 97.8°F | Resp 18 | Ht 74.0 in | Wt 205.0 lb

## 2016-07-21 DIAGNOSIS — R5382 Chronic fatigue, unspecified: Secondary | ICD-10-CM | POA: Insufficient documentation

## 2016-07-21 DIAGNOSIS — F419 Anxiety disorder, unspecified: Secondary | ICD-10-CM

## 2016-07-21 DIAGNOSIS — Z79899 Other long term (current) drug therapy: Secondary | ICD-10-CM

## 2016-07-21 DIAGNOSIS — M25511 Pain in right shoulder: Secondary | ICD-10-CM | POA: Insufficient documentation

## 2016-07-21 DIAGNOSIS — Z87442 Personal history of urinary calculi: Secondary | ICD-10-CM | POA: Insufficient documentation

## 2016-07-21 DIAGNOSIS — R5383 Other fatigue: Secondary | ICD-10-CM | POA: Diagnosis not present

## 2016-07-21 DIAGNOSIS — R296 Repeated falls: Secondary | ICD-10-CM

## 2016-07-21 DIAGNOSIS — M25512 Pain in left shoulder: Secondary | ICD-10-CM | POA: Diagnosis not present

## 2016-07-21 DIAGNOSIS — E119 Type 2 diabetes mellitus without complications: Secondary | ICD-10-CM | POA: Insufficient documentation

## 2016-07-21 DIAGNOSIS — C61 Malignant neoplasm of prostate: Secondary | ICD-10-CM | POA: Insufficient documentation

## 2016-07-21 DIAGNOSIS — F0390 Unspecified dementia without behavioral disturbance: Secondary | ICD-10-CM | POA: Diagnosis not present

## 2016-07-21 DIAGNOSIS — Z7982 Long term (current) use of aspirin: Secondary | ICD-10-CM | POA: Diagnosis not present

## 2016-07-21 DIAGNOSIS — C7951 Secondary malignant neoplasm of bone: Secondary | ICD-10-CM | POA: Insufficient documentation

## 2016-07-21 DIAGNOSIS — I10 Essential (primary) hypertension: Secondary | ICD-10-CM | POA: Diagnosis not present

## 2016-07-21 DIAGNOSIS — C779 Secondary and unspecified malignant neoplasm of lymph node, unspecified: Secondary | ICD-10-CM | POA: Insufficient documentation

## 2016-07-21 DIAGNOSIS — M129 Arthropathy, unspecified: Secondary | ICD-10-CM | POA: Diagnosis not present

## 2016-07-21 DIAGNOSIS — Z87891 Personal history of nicotine dependence: Secondary | ICD-10-CM

## 2016-07-21 DIAGNOSIS — Z9181 History of falling: Secondary | ICD-10-CM

## 2016-07-21 DIAGNOSIS — Z7984 Long term (current) use of oral hypoglycemic drugs: Secondary | ICD-10-CM | POA: Insufficient documentation

## 2016-07-21 DIAGNOSIS — E785 Hyperlipidemia, unspecified: Secondary | ICD-10-CM | POA: Diagnosis not present

## 2016-07-21 LAB — CBC WITH DIFFERENTIAL/PLATELET
BASOS PCT: 1 %
Basophils Absolute: 0 10*3/uL (ref 0–0.1)
EOS ABS: 0.1 10*3/uL (ref 0–0.7)
Eosinophils Relative: 2 %
HEMATOCRIT: 38.2 % — AB (ref 40.0–52.0)
HEMOGLOBIN: 13 g/dL (ref 13.0–18.0)
Lymphocytes Relative: 35 %
Lymphs Abs: 2.6 10*3/uL (ref 1.0–3.6)
MCH: 31.4 pg (ref 26.0–34.0)
MCHC: 34 g/dL (ref 32.0–36.0)
MCV: 92.6 fL (ref 80.0–100.0)
MONOS PCT: 8 %
Monocytes Absolute: 0.6 10*3/uL (ref 0.2–1.0)
NEUTROS ABS: 4 10*3/uL (ref 1.4–6.5)
NEUTROS PCT: 54 %
Platelets: 232 10*3/uL (ref 150–440)
RBC: 4.13 MIL/uL — AB (ref 4.40–5.90)
RDW: 13.6 % (ref 11.5–14.5)
WBC: 7.3 10*3/uL (ref 3.8–10.6)

## 2016-07-21 LAB — CK: Total CK: 182 U/L (ref 49–397)

## 2016-07-21 LAB — COMPREHENSIVE METABOLIC PANEL
ALBUMIN: 3.6 g/dL (ref 3.5–5.0)
ALT: 18 U/L (ref 17–63)
AST: 30 U/L (ref 15–41)
Alkaline Phosphatase: 78 U/L (ref 38–126)
Anion gap: 6 (ref 5–15)
BILIRUBIN TOTAL: 0.8 mg/dL (ref 0.3–1.2)
BUN: 23 mg/dL — ABNORMAL HIGH (ref 6–20)
CALCIUM: 9 mg/dL (ref 8.9–10.3)
CO2: 28 mmol/L (ref 22–32)
CREATININE: 0.92 mg/dL (ref 0.61–1.24)
Chloride: 104 mmol/L (ref 101–111)
GFR calc Af Amer: >60 mL/min (ref >60)
GFR calc non Af Amer: >60 mL/min (ref >60)
GLUCOSE: 206 mg/dL — AB (ref 65–99)
Potassium: 4.5 mmol/L (ref 3.5–5.1)
SODIUM: 138 mmol/L (ref 135–145)
TOTAL PROTEIN: 7.3 g/dL (ref 6.5–8.1)

## 2016-07-21 LAB — LACTIC ACID, PLASMA: LACTIC ACID, VENOUS: 1.5 mmol/L (ref 0.5–1.9)

## 2016-07-21 LAB — PSA: PSA: 54.82 ng/mL — ABNORMAL HIGH (ref 0.00–4.00)

## 2016-07-21 NOTE — Assessment & Plan Note (Addendum)
Metastatic Castrate resistant prostate cancer; stage IV  [ mets to the bone lymph nodes. On X-tandi [July 1st 2017] with lupron q 3 M. PSA 1000- to 0.55; However, PSA- march 2018- 6. Progression noted with increasing PSA; May 2018- CT C/A/P- multiple sclerotic bone lesions; Bone scan- improved.  # However significant decline in performance status- increasing falls/fatigue- Not improved after holding [place] for 4 weeks; question other etiologies. Continue to hold Xtandi at this time. Check CK; lactic acid; thyroid profile today. Also ordered CT scan of the brain with contrast.  #Multifactorial chronic arthritis/metastatic prostate cancer-worsened;  Recommend tramadol; new script given. Hold Xgeva given above acute issues.  # Dementia/delirium- monitor for now; this could definitely impact patient's treatment options/management going forward.  # follow up few days after the CT scan; PhTony[son]- [phone]- if scans are abnormal.

## 2016-07-21 NOTE — Progress Notes (Signed)
[place] OFFICE PROGRESS NOTE  Patient Care Team: Albert Merles, MD as PCP - General (Family Medicine)  Prostate cancer metastatic to bone [facility])   Staging form: Prostate, AJCC 7th Edition     Clinical: Stage IV (T3b, N1, M1, PSA: 20 or greater) - Signed by Albert Gleason, MD on 01/13/2015    Oncology History   SEP 2016- METASTATIC PROSTATE CA to Bone/LN [PSA- 174]; OCT 2016- ADT  # JAN-Feb 2017- CASTRATE RESISTANT PROSTATE CA; Albert Fritz M; Lupron q3M [May 23rd 2017]; PSA- 1200  [June 2017]; July 1st 2017Buddy Fritz X-tandi      Prostate cancer metastatic to bone [facility])   12/31/2014 Initial Diagnosis    Prostate cancer metastatic to bone [facility])        INTERVAL HISTORY:Please outpatient a poor historian/ son is accompanying the patient.  Albert Fritz 83 y.o.  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 Given the decline in his condition/elevated PSA- patient had CT scans bone scans. He is accompanied by his son to review the results. [facility] approximately 4 weeks ago.  Patient continues to complain of increasing pain in his bilateral shoulder areas. As per the son patient has been falling. His confusion/dementia has been getting worse. He has been sleeping a lot. Extreme fatigue. Denies any headaches.  No chest pain or shortness of breath or cough. No nausea no vomiting. No chest pain or shortness of breath. No jaw pain.  REVIEW OF SYSTEMS:  A complete 10 point review of system is done which is negative except mentioned above/history of present illness.   PAST MEDICAL HISTORY :  Past Medical History:  Diagnosis Date  . Anxiety   . Arthritis   . BPH (benign prostatic hyperplasia)   . Cancer ([place])   . Cataract   . Dementia   . Diabetes mellitus without complication ([place])   . Elevated PSA   . Hearing loss    . History of kidney stones   . Hyperlipidemia   . Hypertension   . OA (osteoarthritis)   . Prostatitis   . Urinary retention   . UTI (lower urinary tract infection)     PAST SURGICAL HISTORY :   Past Surgical History:  Procedure Laterality Date  . BACK SURGERY    . BACK SURGERY    . CATARACT EXTRACTION    . HERNIA REPAIR      FAMILY HISTORY :   Family History  Problem Relation Age of Onset  . Throat cancer Father     SOCIAL HISTORY:   Social History  Substance Use Topics  . Smoking status: Former Smoker    Types: Cigarettes  . Smokeless tobacco: Never Used     Comment: quit 30 years ago  . Alcohol use No    ALLERGIES:  has No Known Allergies.  MEDICATIONS:  Current Outpatient Prescriptions  Medication Sig Dispense Refill  . acetaminophen (TYLENOL) 650 MG CR tablet Take 650 mg by mouth every 8 (eight) hours as needed for pain.    Marland Kitchen aspirin 81 MG chewable tablet Chew 81 mg by mouth 2 (two) times daily.     Marland Kitchen atorvastatin (LIPITOR) 10 MG tablet Take 10 mg by mouth daily.    Marland Kitchen glipiZIDE (GLUCOTROL) 5 MG tablet Take 5 mg by mouth daily before breakfast. Reported on 07/24/2015    . traMADol (ULTRAM) 50 MG  tablet Take 1 tablet (50 mg total) by mouth every 8 (eight) hours as needed. 90 tablet 0  . Vitamin D, Ergocalciferol, (DRISDOL) 50000 units CAPS capsule Take 50,000 Units by mouth every 7 (seven) days.     Albert Fritz 40 MG capsule TAKE 4 CAPSULES (160 MG TOTAL) BY MOUTH ONCE DAILY AT THE SAME TIME. MAY TAKE WITH OR WITHOUT FOOD. SWALLOW WHOLE. 120 capsule 12   No current facility-administered medications for this visit.     PHYSICAL EXAMINATION: ECOG PERFORMANCE STATUS: 2 - Symptomatic, <50% confined to bed  BP 121/72 (BP Location: Left Arm, Patient Position: Sitting)   Pulse (!) 53   Temp 97.8 F (36.6 C) (Tympanic)   Resp 18   Ht 6\' 2"  (1.88 m)   Wt 205 lb (93 kg)   BMI 26.32 kg/m   Filed Weights   07/21/16 0925  Weight: 205 lb (93 kg)    GENERAL:  Well-nourished well-developed; Alert, no distress and comfortable.  Accompanied by his son. Patient is wheelchair. EYES: no pallor or icterus OROPHARYNX: no thrush or ulceration;  NECK: supple, no masses felt LYMPH:  no palpable lymphadenopathy in the cervical, axillary or inguinal regions LUNGS: clear to auscultation and  No wheeze or crackles HEART/CVS: regular rate & rhythm and no murmurs; No lower extremity edema ABDOMEN:abdomen soft, non-tender and normal bowel sounds Musculoskeletal:no cyanosis of digits and no clubbing  PSYCH: drowsy & oriented x 2-3 with flat affect.  NEURO: no focal motor/sensory deficits SKIN:  no rashes or significant lesions  LABORATORY DATA:  I have reviewed the data as listed    Component Value Date/Time   NA 138 07/21/2016 0855   K 4.5 07/21/2016 0855   CL 104 07/21/2016 0855   CO2 28 07/21/2016 0855   GLUCOSE 206 (H) 07/21/2016 0855   BUN 23 (H) 07/21/2016 0855   CREATININE 0.92 07/21/2016 0855   CALCIUM 9.0 07/21/2016 0855   PROT 7.3 07/21/2016 0855   ALBUMIN 3.6 07/21/2016 0855   AST 30 07/21/2016 0855   ALT 18 07/21/2016 0855   ALKPHOS 78 07/21/2016 0855   BILITOT 0.8 07/21/2016 0855   GFRNONAA >60 07/21/2016 0855   GFRAA >60 07/21/2016 0855    No results found for: SPEP, UPEP  Lab Results  Component Value Date   WBC 7.3 07/21/2016   NEUTROABS 4.0 07/21/2016   HGB 13.0 07/21/2016   HCT 38.2 (L) 07/21/2016   MCV 92.6 07/21/2016   PLT 232 07/21/2016      Chemistry      Component Value Date/Time   NA 138 07/21/2016 0855   K 4.5 07/21/2016 0855   CL 104 07/21/2016 0855   CO2 28 07/21/2016 0855   BUN 23 (H) 07/21/2016 0855   CREATININE 0.92 07/21/2016 0855      Component Value Date/Time   CALCIUM 9.0 07/21/2016 0855   ALKPHOS 78 07/21/2016 0855   AST 30 07/21/2016 0855   ALT 18 07/21/2016 0855   BILITOT 0.8 07/21/2016 0855     Results for Albert, Fritz (MRN [id number]) as of 01/26/2016 12:01  Ref. Range 08/25/2015 10:15  09/22/2015 10:50 10/23/2015 09:35 11/24/2015 11:07 12/22/2015 09:50  PSA Latest Ref Range: 0.00 - 4.00 ng/mL 1,024.00 (H) 9.36 (H) 0.12 0.03 0.06    RADIOGRAPHIC STUDIES: I have personally reviewed the radiological images as listed and agreed with the findings in the report. No results found.   ASSESSMENT & PLAN:  Prostate cancer metastatic to bone [facility]) Metastatic Castrate resistant prostate  cancer; stage IV  [ mets to the bone lymph nodes. On X-tandi [July 1st 2017] with lupron q 3 M. PSA 1000- to 0.55; However, PSA- march 2018- 6. Progression noted with increasing PSA; May 2018- CT C/A/P- multiple sclerotic bone lesions; Bone scan- improved.  # However significant decline in performance status- increasing falls/fatigue- Not improved after holding [place] for 4 weeks; question other etiologies. Continue to hold Xtandi at this time. Check CK; lactic acid; thyroid profile today. Also ordered CT scan of the brain with contrast.  #Multifactorial chronic arthritis/metastatic prostate cancer-worsened;  Recommend tramadol; new script given. Hold Xgeva given above acute issues.  # Dementia/delirium- monitor for now; this could definitely impact patient's treatment options/management going forward.  # follow up few days after the CT scan; PhTony[son]- [phone]- if scans are abnormal.  Orders Placed This Encounter  Procedures  . CT HEAD W & WO CONTRAST    Standing Status:   Future    Standing Expiration Date:   10/20/2017    Order Specific Question:   Reason for Exam (SYMPTOM  OR DIAGNOSIS REQUIRED)    Answer:   prostate cancer    Order Specific Question:   Preferred imaging location?    Answer:   [facility]  . CK    Standing Status:   Future    Number of Occurrences:   1    Standing Expiration Date:   07/21/2017  . Lactic Acid, Plasma  . Thyroid Panel With TSH    Standing Status:   Future    Number of Occurrences:   1    Standing Expiration Date:   07/21/2017  . Lactic Acid, Plasma     Standing Status:   Future    Number of Occurrences:   1    Standing Expiration Date:   07/21/2017   All questions were answered. The patient knows to call the clinic with any problems, questions or concerns.     Cammie Sickle, MD 07/22/2016 10:08 AM

## 2016-07-22 ENCOUNTER — Ambulatory Visit: Payer: Medicare HMO

## 2016-07-22 LAB — THYROID PANEL WITH TSH
Free Thyroxine Index: 1.8 (ref 1.2–4.9)
T3 UPTAKE RATIO: 30 % (ref 24–39)
T4 TOTAL: 5.9 ug/dL (ref 4.5–12.0)
TSH: 1.75 u[IU]/mL (ref 0.450–4.500)

## 2016-07-23 ENCOUNTER — Ambulatory Visit: Payer: Medicare HMO | Admitting: Internal Medicine

## 2016-07-28 ENCOUNTER — Ambulatory Visit
Admission: RE | Admit: 2016-07-28 | Discharge: 2016-07-28 | Disposition: A | Discharge status: Home / Self Care | Payer: Medicare HMO | Source: Ambulatory Visit | Attending: Internal Medicine | Admitting: Internal Medicine

## 2016-07-28 DIAGNOSIS — R5382 Chronic fatigue, unspecified: Secondary | ICD-10-CM | POA: Insufficient documentation

## 2016-07-28 DIAGNOSIS — C7951 Secondary malignant neoplasm of bone: Secondary | ICD-10-CM

## 2016-07-28 DIAGNOSIS — C61 Malignant neoplasm of prostate: Secondary | ICD-10-CM | POA: Diagnosis not present

## 2016-07-28 DIAGNOSIS — G319 Degenerative disease of nervous system, unspecified: Secondary | ICD-10-CM | POA: Diagnosis not present

## 2016-07-28 DIAGNOSIS — R296 Repeated falls: Secondary | ICD-10-CM

## 2016-07-28 MED ORDER — IOPAMIDOL (ISOVUE-300) INJECTION 61%
75.0000 mL | Freq: Once | INTRAVENOUS | Status: AC | PRN
  Administered 2016-07-28: 75 mL via INTRAVENOUS

## 2016-07-29 ENCOUNTER — Inpatient Hospital Stay (HOSPITAL_BASED_OUTPATIENT_CLINIC_OR_DEPARTMENT_OTHER): Payer: Medicare HMO | Admitting: Internal Medicine

## 2016-07-29 VITALS — BP 103/62 | HR 82 | Temp 96.8°F | Resp 18 | Wt 207.0 lb

## 2016-07-29 DIAGNOSIS — E785 Hyperlipidemia, unspecified: Secondary | ICD-10-CM | POA: Diagnosis not present

## 2016-07-29 DIAGNOSIS — F0390 Unspecified dementia without behavioral disturbance: Secondary | ICD-10-CM

## 2016-07-29 DIAGNOSIS — M129 Arthropathy, unspecified: Secondary | ICD-10-CM | POA: Diagnosis not present

## 2016-07-29 DIAGNOSIS — F419 Anxiety disorder, unspecified: Secondary | ICD-10-CM | POA: Diagnosis not present

## 2016-07-29 DIAGNOSIS — I10 Essential (primary) hypertension: Secondary | ICD-10-CM | POA: Diagnosis not present

## 2016-07-29 DIAGNOSIS — R5383 Other fatigue: Secondary | ICD-10-CM | POA: Diagnosis not present

## 2016-07-29 DIAGNOSIS — M25512 Pain in left shoulder: Secondary | ICD-10-CM | POA: Diagnosis not present

## 2016-07-29 DIAGNOSIS — C7951 Secondary malignant neoplasm of bone: Secondary | ICD-10-CM | POA: Diagnosis not present

## 2016-07-29 DIAGNOSIS — C61 Malignant neoplasm of prostate: Secondary | ICD-10-CM

## 2016-07-29 DIAGNOSIS — C779 Secondary and unspecified malignant neoplasm of lymph node, unspecified: Secondary | ICD-10-CM | POA: Diagnosis not present

## 2016-07-29 DIAGNOSIS — E119 Type 2 diabetes mellitus without complications: Secondary | ICD-10-CM

## 2016-07-29 DIAGNOSIS — Z87891 Personal history of nicotine dependence: Secondary | ICD-10-CM

## 2016-07-29 DIAGNOSIS — Z87442 Personal history of urinary calculi: Secondary | ICD-10-CM

## 2016-07-29 DIAGNOSIS — Z7984 Long term (current) use of oral hypoglycemic drugs: Secondary | ICD-10-CM

## 2016-07-29 DIAGNOSIS — Z9181 History of falling: Secondary | ICD-10-CM

## 2016-07-29 DIAGNOSIS — M25511 Pain in right shoulder: Secondary | ICD-10-CM

## 2016-07-29 DIAGNOSIS — Z79899 Other long term (current) drug therapy: Secondary | ICD-10-CM

## 2016-07-29 DIAGNOSIS — Z7982 Long term (current) use of aspirin: Secondary | ICD-10-CM

## 2016-07-29 MED ORDER — CITALOPRAM HYDROBROMIDE 20 MG PO TABS: 20.0000 mg | ORAL_TABLET | Freq: Every day | ORAL | 1 refills | Status: AC

## 2016-07-29 NOTE — Progress Notes (Signed)
[place] OFFICE PROGRESS NOTE  Patient Care Team: Marguerita Merles, MD as PCP - General (Family Medicine)  Prostate cancer metastatic to bone [facility])   Staging form: Prostate, AJCC 7th Edition     Clinical: Stage IV (T3b, N1, M1, PSA: 20 or greater) - Signed by Forest Gleason, MD on 01/13/2015    Oncology History   SEP 2016- METASTATIC PROSTATE CA to Bone/LN [PSA- 466]; OCT 2016- ADT  # JAN-Feb 2017- CASTRATE RESISTANT PROSTATE CA; [name]; Lupron q3M [May 23rd 2017]; PSA- 1200  [June 2017]; July 1st 2017[name]      Prostate cancer metastatic to bone [facility])   12/31/2014 Initial Diagnosis    Prostate cancer metastatic to bone [facility])        INTERVAL HISTORY:Please outpatient a poor historian/ son is accompanying the patient.  Albert Fritz 83 y.o.  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 Currently Albert Fritz is on hold- for the last 6 weeks because of falls/worsening fatigue. The recent workup including CT of the chest abdomen and pelvis with contrast/bone scan- showed multiple sclerotic lesions in the bone otherwise no acute process to explain his progressive weakness.   Today's accompanied by his son- to review the results of CT brain.   Patient continues to complain of increasing pain in his bilateral shoulder areas. As per the son patient has been falling. His confusion/dementia has been getting worse. He has been sleeping a lot. Extreme fatigue.  No chest pain or shortness of breath or cough. No nausea no vomiting. No chest pain or shortness of breath. No jaw pain.  REVIEW OF SYSTEMS:  A complete 10 point review of system is done which is negative except mentioned above/history of present illness.   PAST MEDICAL HISTORY :  Past Medical History:  Diagnosis Date  . Anxiety   . Arthritis   . BPH (benign prostatic hyperplasia)   . Cancer ([place])   .  Cataract   . Dementia   . Diabetes mellitus without complication ([place])   . Elevated PSA   . Hearing loss   . History of kidney stones   . Hyperlipidemia   . Hypertension   . OA (osteoarthritis)   . Prostatitis   . Urinary retention   . UTI (lower urinary tract infection)     PAST SURGICAL HISTORY :   Past Surgical History:  Procedure Laterality Date  . BACK SURGERY    . BACK SURGERY    . CATARACT EXTRACTION    . HERNIA REPAIR      FAMILY HISTORY :   Family History  Problem Relation Age of Onset  . Throat cancer Father     SOCIAL HISTORY:   Social History  Substance Use Topics  . Smoking status: Former Smoker    Types: Cigarettes  . Smokeless tobacco: Never Used     Comment: quit 30 years ago  . Alcohol use No    ALLERGIES:  has No Known Allergies.  MEDICATIONS:  Current Outpatient Prescriptions  Medication Sig Dispense Refill  . acetaminophen (TYLENOL) 650 MG CR tablet Take 650 mg by mouth every 8 (eight) hours as needed for pain.    Albert Kitchen aspirin 81 MG chewable tablet Chew 81 mg by mouth 2 (two) times daily.     Albert Kitchen atorvastatin (LIPITOR) 10 MG tablet Take 10 mg by mouth daily.    Albert Kitchen glipiZIDE (GLUCOTROL) 5 MG tablet Take  5 mg by mouth daily before breakfast. Reported on 07/24/2015    . traMADol (ULTRAM) 50 MG tablet Take 1 tablet (50 mg total) by mouth every 8 (eight) hours as needed. 90 tablet 0  . Vitamin D, Ergocalciferol, (DRISDOL) 50000 units CAPS capsule Take 50,000 Units by mouth every 7 (seven) days.     . citalopram (CELEXA) 20 MG tablet Take 1 tablet (20 mg total) by mouth daily. 60 tablet 1  . XTANDI 40 MG capsule TAKE 4 CAPSULES (160 MG TOTAL) BY MOUTH ONCE DAILY AT THE SAME TIME. MAY TAKE WITH OR WITHOUT FOOD. SWALLOW WHOLE. (Patient not taking: Reported on 07/29/2016) 120 capsule 12   No current facility-administered medications for this visit.     PHYSICAL EXAMINATION: ECOG PERFORMANCE STATUS: 2 - Symptomatic, <50% confined to bed  BP 103/62 (BP  Location: Left Arm, Patient Position: Sitting)   Pulse 82   Temp (!) 96.8 F (36 C) (Tympanic)   Resp 18   Wt 207 lb (93.9 kg)   SpO2 98%   BMI 26.58 kg/m   Filed Weights   07/29/16 0841  Weight: 207 lb (93.9 kg)    GENERAL: Well-nourished well-developed; Alert, no distress and comfortable.  Accompanied by his son. Patient is wheelchair. EYES: no pallor or icterus OROPHARYNX: no thrush or ulceration;  NECK: supple, no masses felt LYMPH:  no palpable lymphadenopathy in the cervical, axillary or inguinal regions LUNGS: clear to auscultation and  No wheeze or crackles HEART/CVS: regular rate & rhythm and no murmurs; No lower extremity edema ABDOMEN:abdomen soft, non-tender and normal bowel sounds Musculoskeletal:no cyanosis of digits and no clubbing  PSYCH: drowsy & oriented x 2-3 with flat affect.  NEURO: no focal motor/sensory deficits SKIN:  no rashes or significant lesions  LABORATORY DATA:  I have reviewed the data as listed    Component Value Date/Time   NA 138 07/21/2016 0855   K 4.5 07/21/2016 0855   CL 104 07/21/2016 0855   CO2 28 07/21/2016 0855   GLUCOSE 206 (H) 07/21/2016 0855   BUN 23 (H) 07/21/2016 0855   CREATININE 0.92 07/21/2016 0855   CALCIUM 9.0 07/21/2016 0855   PROT 7.3 07/21/2016 0855   ALBUMIN 3.6 07/21/2016 0855   AST 30 07/21/2016 0855   ALT 18 07/21/2016 0855   ALKPHOS 78 07/21/2016 0855   BILITOT 0.8 07/21/2016 0855   GFRNONAA >60 07/21/2016 0855   GFRAA >60 07/21/2016 0855    No results found for: SPEP, UPEP  Lab Results  Component Value Date   WBC 7.3 07/21/2016   NEUTROABS 4.0 07/21/2016   HGB 13.0 07/21/2016   HCT 38.2 (L) 07/21/2016   MCV 92.6 07/21/2016   PLT 232 07/21/2016      Chemistry      Component Value Date/Time   NA 138 07/21/2016 0855   K 4.5 07/21/2016 0855   CL 104 07/21/2016 0855   CO2 28 07/21/2016 0855   BUN 23 (H) 07/21/2016 0855   CREATININE 0.92 07/21/2016 0855      Component Value Date/Time    CALCIUM 9.0 07/21/2016 0855   ALKPHOS 78 07/21/2016 0855   AST 30 07/21/2016 0855   ALT 18 07/21/2016 0855   BILITOT 0.8 07/21/2016 0855     Results for Albert Fritz, Albert Fritz (MRN [id number]) as of 01/26/2016 12:01  Ref. Range 08/25/2015 10:15 09/22/2015 10:50 10/23/2015 09:35 11/24/2015 11:07 12/22/2015 09:50  PSA Latest Ref Range: 0.00 - 4.00 ng/mL 1,024.00 (H) 9.36 (H) 0.12 0.03 0.06  RADIOGRAPHIC STUDIES: I have personally reviewed the radiological images as listed and agreed with the findings in the report. No results found.   ASSESSMENT & PLAN:  Prostate cancer metastatic to bone [facility]) Metastatic Castrate resistant prostate cancer; stage IV  [ mets to the bone lymph nodes. On X-tandi [July 1st 2017] with lupron q 3 M. PSA 1000.  Progression noted with increasing PSA; May 2018- CT C/A/P- multiple sclerotic bone lesions; Bone scan- improved. Most recent PSA May 2018-increasing up to 32. Continue to HOLD X-tandi given acute issues [discussed below]  # However significant decline in performance status- increasing falls/fatigue- extensive workup including CK/lactic acid/thyroid profile- normal. CT scan of the brain with contrast-is also negative for any metastatic disease/or leptomeningeal spread. Question depression- recommend starting the patient on Celexa. New prescription given.  # Dementia/delirium/extreme fatigue- hard to explain the recent worsening of the clinical status.  Recommend referral to neurology.   # follow up in 6 weeks/labs/Lupron.    Cc; Dr.Miles/labs.  Orders Placed This Encounter  Procedures  . CBC with Differential/Platelet    Standing Status:   Future    Standing Expiration Date:   07/29/2017  . Comprehensive metabolic panel    Standing Status:   Future    Standing Expiration Date:   07/29/2017  . PSA    Standing Status:   Future    Standing Expiration Date:   07/29/2017  . Testosterone    Standing Status:   Future    Standing Expiration Date:   07/29/2017  .  Ambulatory referral to Neurology    Referral Priority:   Routine    Referral Type:   Consultation    Referral Reason:   Specialty Services Required    Referred to Provider:   Lang Snow, NP    Requested Specialty:   Neurology    Number of Visits Requested:   1   All questions were answered. The patient knows to call the clinic with any problems, questions or concerns.     Cammie Sickle, MD 08/02/2016 4:16 PM

## 2016-07-29 NOTE — Assessment & Plan Note (Addendum)
Metastatic Castrate resistant prostate cancer; stage IV  [ mets to the bone lymph nodes. On X-tandi [July 1st 2017] with lupron q 3 M. PSA 1000.  Progression noted with increasing PSA; May 2018- CT C/A/P- multiple sclerotic bone lesions; Bone scan- improved. Most recent PSA May 2018-increasing up to 32. Continue to HOLD X-tandi given acute issues [discussed below]  # However significant decline in performance status- increasing falls/fatigue- extensive workup including CK/lactic acid/thyroid profile- normal. CT scan of the brain with contrast-is also negative for any metastatic disease/or leptomeningeal spread. Question depression- recommend starting the patient on Celexa. New prescription given.  # Dementia/delirium/extreme fatigue- hard to explain the recent worsening of the clinical status.  Recommend referral to neurology.   # follow up in 6 weeks/labs/Lupron.    Cc; Dr.Miles/labs.

## 2016-07-29 NOTE — Progress Notes (Signed)
Patient here today for follow up.   

## 2016-08-02 ENCOUNTER — Telehealth: Payer: Self-pay | Admitting: Internal Medicine

## 2016-08-02 NOTE — Telephone Encounter (Signed)
Please fax a copy of the patient's most recent labs CBC CMP PSA- PCPs office. thx

## 2016-08-03 NOTE — Telephone Encounter (Signed)
Labs faxed to pcp as requested.

## 2016-08-27 ENCOUNTER — Emergency Department: Payer: Medicare HMO

## 2016-08-27 ENCOUNTER — Encounter: Payer: Self-pay | Admitting: Emergency Medicine

## 2016-08-27 ENCOUNTER — Observation Stay
Admission: EM | Admit: 2016-08-27 | Discharge: 2016-08-31 | Disposition: A | Discharge status: Home / Self Care | Payer: Medicare HMO | Attending: Internal Medicine | Admitting: Internal Medicine

## 2016-08-27 DIAGNOSIS — R2681 Unsteadiness on feet: Secondary | ICD-10-CM | POA: Diagnosis not present

## 2016-08-27 DIAGNOSIS — I4892 Unspecified atrial flutter: Secondary | ICD-10-CM | POA: Insufficient documentation

## 2016-08-27 DIAGNOSIS — R4182 Altered mental status, unspecified: Secondary | ICD-10-CM

## 2016-08-27 DIAGNOSIS — F329 Major depressive disorder, single episode, unspecified: Secondary | ICD-10-CM | POA: Insufficient documentation

## 2016-08-27 DIAGNOSIS — F419 Anxiety disorder, unspecified: Secondary | ICD-10-CM | POA: Insufficient documentation

## 2016-08-27 DIAGNOSIS — N3000 Acute cystitis without hematuria: Secondary | ICD-10-CM

## 2016-08-27 DIAGNOSIS — E119 Type 2 diabetes mellitus without complications: Secondary | ICD-10-CM | POA: Diagnosis not present

## 2016-08-27 DIAGNOSIS — E860 Dehydration: Secondary | ICD-10-CM | POA: Diagnosis not present

## 2016-08-27 DIAGNOSIS — M6281 Muscle weakness (generalized): Secondary | ICD-10-CM | POA: Diagnosis not present

## 2016-08-27 DIAGNOSIS — F0390 Unspecified dementia without behavioral disturbance: Secondary | ICD-10-CM | POA: Insufficient documentation

## 2016-08-27 DIAGNOSIS — M1990 Unspecified osteoarthritis, unspecified site: Secondary | ICD-10-CM | POA: Diagnosis not present

## 2016-08-27 DIAGNOSIS — N390 Urinary tract infection, site not specified: Principal | ICD-10-CM | POA: Insufficient documentation

## 2016-08-27 DIAGNOSIS — B9620 Unspecified Escherichia coli [E. coli] as the cause of diseases classified elsewhere: Secondary | ICD-10-CM | POA: Diagnosis not present

## 2016-08-27 DIAGNOSIS — Z7984 Long term (current) use of oral hypoglycemic drugs: Secondary | ICD-10-CM | POA: Insufficient documentation

## 2016-08-27 DIAGNOSIS — E785 Hyperlipidemia, unspecified: Secondary | ICD-10-CM | POA: Insufficient documentation

## 2016-08-27 DIAGNOSIS — I10 Essential (primary) hypertension: Secondary | ICD-10-CM | POA: Diagnosis not present

## 2016-08-27 DIAGNOSIS — N400 Enlarged prostate without lower urinary tract symptoms: Secondary | ICD-10-CM | POA: Diagnosis not present

## 2016-08-27 LAB — URINALYSIS, COMPLETE (UACMP) WITH MICROSCOPIC
Bilirubin Urine: NEGATIVE
Glucose, UA: NEGATIVE mg/dL
Hgb urine dipstick: NEGATIVE
KETONES UR: NEGATIVE mg/dL
Nitrite: POSITIVE — AB
PH: 6 (ref 5.0–8.0)
Protein, ur: NEGATIVE mg/dL
SQUAMOUS EPITHELIAL / LPF: NONE SEEN
Specific Gravity, Urine: 1.018 (ref 1.005–1.030)

## 2016-08-27 LAB — CBC
HCT: 39.4 % — ABNORMAL LOW (ref 40.0–52.0)
Hemoglobin: 13.5 g/dL (ref 13.0–18.0)
MCH: 31.4 pg (ref 26.0–34.0)
MCHC: 34.3 g/dL (ref 32.0–36.0)
MCV: 91.6 fL (ref 80.0–100.0)
PLATELETS: 218 10*3/uL (ref 150–440)
RBC: 4.3 MIL/uL — AB (ref 4.40–5.90)
RDW: 13.3 % (ref 11.5–14.5)
WBC: 11 10*3/uL — ABNORMAL HIGH (ref 3.8–10.6)

## 2016-08-27 LAB — BASIC METABOLIC PANEL
Anion gap: 6 (ref 5–15)
BUN: 26 mg/dL — ABNORMAL HIGH (ref 6–20)
CO2: 28 mmol/L (ref 22–32)
Calcium: 8.6 mg/dL — ABNORMAL LOW (ref 8.9–10.3)
Chloride: 103 mmol/L (ref 101–111)
Creatinine, Ser: 0.86 mg/dL (ref 0.61–1.24)
GFR calc Af Amer: >60 mL/min (ref >60)
GFR calc non Af Amer: >60 mL/min (ref >60)
Glucose, Bld: 129 mg/dL — ABNORMAL HIGH (ref 65–99)
Potassium: 4.6 mmol/L (ref 3.5–5.1)
Sodium: 137 mmol/L (ref 135–145)

## 2016-08-27 MED ORDER — CEFTRIAXONE SODIUM 2 G IJ SOLR
2.0000 g | Freq: Once | INTRAMUSCULAR | Status: AC
  Administered 2016-08-27: 2 g via INTRAVENOUS
  Filled 2016-08-27: qty 2

## 2016-08-27 MED ORDER — SODIUM CHLORIDE 0.9 % IV BOLUS (SEPSIS)
500.0000 mL | Freq: Once | INTRAVENOUS | Status: AC
  Administered 2016-08-27: 500 mL via INTRAVENOUS

## 2016-08-27 MED ORDER — CEFTRIAXONE SODIUM-DEXTROSE 1-3.74 GM-% IV SOLR
1.0000 g | INTRAVENOUS | Status: DC
  Filled 2016-08-27: qty 50

## 2016-08-27 NOTE — Progress Notes (Signed)
Pharmacy Antibiotic Note  Albert Fritz is a 83 y.o. male admitted on 08/27/2016 with UTI.  Pharmacy has been consulted for ceftriaxone dosing.  Plan: Patient received ceftriaxone 2g IV x 1 in ED  Will start ceftriaxone 1g IV daily starting 6/23 @ 2200     Temp (24hrs), Avg:98.3 F (36.8 C), Min:98.3 F (36.8 C), Max:98.3 F (36.8 C)   Recent Labs Lab 08/27/16 2053  WBC 11.0*  CREATININE 0.86    CrCl cannot be calculated (Unknown ideal weight.).    No Known Allergies    Thank you for allowing pharmacy to be a part of this patient's care.  Tobie Lords, PharmD, BCPS Clinical Pharmacist 08/27/2016

## 2016-08-27 NOTE — ED Provider Notes (Signed)
[facility] Emergency Department Provider Note    First MD Initiated Contact with Patient 08/27/16 2101     (approximate)  I have reviewed the triage vital signs and the nursing notes.   HISTORY  Chief Complaint Weakness and Altered Mental Status  Level V Caveat:  AMS dementia  HPI Albert Fritz is a 83 y.o. male history dementia as well as prostate cancer presents with altered mental status and weakness from home. Patient is unable to get up and walk. EMS found him and states that he smelled like urine. Had outpatient testing ordered to evaluate for urinary tract infection but has not received those results yet. The patient is ill-appearing and very confused which is not his baseline despite history of dementia. Patient actually lives at home alone with no assistance and this is a significant change from his baseline mental status.   Past Medical History:  Diagnosis Date  . Anxiety   . Arthritis   . BPH (benign prostatic hyperplasia)   . Cancer ([place])   . Cataract   . Dementia   . Diabetes mellitus without complication ([place])   . Elevated PSA   . Hearing loss   . History of kidney stones   . Hyperlipidemia   . Hypertension   . OA (osteoarthritis)   . Prostatitis   . Urinary retention   . UTI (lower urinary tract infection)    Family History  Problem Relation Age of Onset  . Throat cancer Father    Past Surgical History:  Procedure Laterality Date  . BACK SURGERY    . BACK SURGERY    . CATARACT EXTRACTION    . HERNIA REPAIR     Patient Active Problem List   Diagnosis Date Noted  . Chronic fatigue 07/21/2016  . Falls frequently 07/21/2016  . Neck pain, bilateral 10/23/2015  . Fall 08/25/2015  . Pain in lower back 08/25/2015  . Hyponatremia 08/25/2015  . Financial difficulties 08/25/2015  . Prostate cancer metastatic to bone ([place]) 12/31/2014  . Elevated PSA 12/05/2014  . Urinary retention 12/05/2014  . BPH (benign prostatic  hypertrophy) with urinary retention 12/05/2014  . Essential (primary) hypertension 07/11/2014  . Atrial flutter ([place]) 07/11/2014  . Hypertension       Prior to Admission medications   Medication Sig Start Date End Date Taking? Authorizing Provider  acetaminophen (TYLENOL) 650 MG CR tablet Take 650 mg by mouth every 8 (eight) hours as needed for pain.   Yes [provider]  aspirin 81 MG chewable tablet Chew 81 mg by mouth 2 (two) times daily.    Yes [provider]  atorvastatin (LIPITOR) 10 MG tablet Take 10 mg by mouth daily.   Yes [provider]  citalopram (CELEXA) 20 MG tablet Take 1 tablet (20 mg total) by mouth daily. 07/29/16  Yes Cammie Sickle, MD  glipiZIDE (GLUCOTROL) 5 MG tablet Take 5 mg by mouth daily before breakfast. Reported on 07/24/2015   Yes [provider]  traMADol (ULTRAM) 50 MG tablet Take 1 tablet (50 mg total) by mouth every 8 (eight) hours as needed. 06/21/16  Yes Cammie Sickle, MD  Vitamin D, Ergocalciferol, (DRISDOL) 50000 units CAPS capsule Take 50,000 Units by mouth every 7 (seven) days.    Yes [provider]  XTANDI 40 MG capsule TAKE 4 CAPSULES (160 MG TOTAL) BY MOUTH ONCE DAILY AT THE SAME TIME. MAY TAKE WITH OR WITHOUT FOOD. SWALLOW WHOLE. Patient not taking: Reported on  07/29/2016 09/22/15   Cammie Sickle, MD    Allergies Patient has no known allergies.    Social History Social History  Substance Use Topics  . Smoking status: Former Smoker    Types: Cigarettes  . Smokeless tobacco: Never Used     Comment: quit 30 years ago  . Alcohol use No    Review of Systems Patient denies headaches, rhinorrhea, blurry vision, numbness, shortness of breath, chest pain, edema, cough, abdominal pain, nausea, vomiting, diarrhea, dysuria, fevers, rashes or hallucinations unless otherwise stated above in HPI. ____________________________________________   PHYSICAL EXAM:  VITAL SIGNS: Vitals:    08/27/16 2230 08/27/16 2300  BP: (!) 160/82 118/65  Pulse: 80 86  Resp: 16 17  Temp:      Constitutional: Alert and in no acute distress. Eyes: Conjunctivae are normal.  Head: Atraumatic. Nose: No congestion/rhinnorhea. Mouth/Throat: Mucous membranes are moist.   Neck: No stridor. Painless ROM.  Cardiovascular: Normal rate, regular rhythm. Grossly normal heart sounds.  Good peripheral circulation. Respiratory: Normal respiratory effort.  No retractions. Lungs CTAB. Gastrointestinal: Soft and nontender. No distention. No abdominal bruits. No CVA tenderness. Genitourinary: no perineal erythema or crepitus Musculoskeletal: No lower extremity tenderness nor edema.  No joint effusions. Neurologic:   No gross focal neurologic deficits are appreciated. No facial droop Skin:  Skin is warm, dry and intact. No rash noted. Psychiatric: Mood and affect are normal. Speech and behavior are normal.  ____________________________________________   LABS (all labs ordered are listed, but only abnormal results are displayed)  Results for orders placed or performed during the hospital encounter of 08/27/16 (from the past 24 hour(s))  Basic metabolic panel     Status: Abnormal   Collection Time: 08/27/16  8:53 PM  Result Value Ref Range   Sodium 137 135 - 145 mmol/L   Potassium 4.6 3.5 - 5.1 mmol/L   Chloride 103 101 - 111 mmol/L   CO2 28 22 - 32 mmol/L   Glucose, Bld 129 (H) 65 - 99 mg/dL   BUN 26 (H) 6 - 20 mg/dL   Creatinine, Ser 0.86 0.61 - 1.24 mg/dL   Calcium 8.6 (L) 8.9 - 10.3 mg/dL   GFR calc non Af Amer >60 >60 mL/min   GFR calc Af Amer >60 >60 mL/min   Anion gap 6 5 - 15  CBC     Status: Abnormal   Collection Time: 08/27/16  8:53 PM  Result Value Ref Range   WBC 11.0 (H) 3.8 - 10.6 K/uL   RBC 4.30 (L) 4.40 - 5.90 MIL/uL   Hemoglobin 13.5 13.0 - 18.0 g/dL   HCT 39.4 (L) 40.0 - 52.0 %   MCV 91.6 80.0 - 100.0 fL   MCH 31.4 26.0 - 34.0 pg   MCHC 34.3 32.0 - 36.0 g/dL   RDW 13.3  11.5 - 14.5 %   Platelets 218 150 - 440 K/uL  Urinalysis, Complete w Microscopic     Status: Abnormal   Collection Time: 08/27/16  8:55 PM  Result Value Ref Range   Color, Urine YELLOW (A) YELLOW   APPearance CLEAR (A) CLEAR   Specific Gravity, Urine 1.018 1.005 - 1.030   pH 6.0 5.0 - 8.0   Glucose, UA NEGATIVE NEGATIVE mg/dL   Hgb urine dipstick NEGATIVE NEGATIVE   Bilirubin Urine NEGATIVE NEGATIVE   Ketones, ur NEGATIVE NEGATIVE mg/dL   Protein, ur NEGATIVE NEGATIVE mg/dL   Nitrite POSITIVE (A) NEGATIVE   Leukocytes, UA SMALL (A) NEGATIVE   RBC /  HPF 0-5 0 - 5 RBC/hpf   WBC, UA TOO NUMEROUS TO COUNT 0 - 5 WBC/hpf   Bacteria, UA FEW (A) NONE SEEN   Squamous Epithelial / LPF NONE SEEN NONE SEEN   Mucous PRESENT    ____________________________________________  EKG My review and personal interpretation at Time: 20:52   Indication: ams  Rate: 80  Rhythm: sinus Axis: left Other: no STEMI, normal intervals ____________________________________________  RADIOLOGY  I personally reviewed all radiographic images ordered to evaluate for the above acute complaints and reviewed radiology reports and findings.  These findings were personally discussed with the patient.  Please see medical record for radiology report.  ____________________________________________   PROCEDURES  Procedure(s) performed:  Procedures    Critical Care performed: no ____________________________________________   INITIAL IMPRESSION / ASSESSMENT AND PLAN / ED COURSE  Pertinent labs & imaging results that were available during my care of the patient were reviewed by me and considered in my medical decision making (see chart for details).  DDX: uti, sepsis, dehydration, electrolye abn, pna  Jaqualin A Paull is a 83 y.o. who presents to the ED with altered mental status and evidence of urinary tract infection. Blood work is otherwise fairly reassuring. Patient is acutely altered from his baseline. No  evidence of pneumonia or heart failure. Patient provided IV fluids and IV antibiotics. Urine culture sent and blood culture sent the patient not overtly septic. I do feel patient will require admission for additional IV antibiotics and further evaluation and management.      ____________________________________________   FINAL CLINICAL IMPRESSION(S) / ED DIAGNOSES  Final diagnoses:  Altered mental status, unspecified altered mental status type  Acute cystitis without hematuria      NEW MEDICATIONS STARTED DURING THIS VISIT:  New Prescriptions   No medications on file     Note:  This document was prepared using Dragon voice recognition software and may include unintentional dictation errors.    Merlyn Lot, MD 08/28/16 Quentin Mulling

## 2016-08-27 NOTE — ED Triage Notes (Addendum)
Patient arrives from home with co weakness as reported by family to EMS.  Pt is normally ambulatory but today is unable to and also has decreased oral intake today.  Patient smells like urine during triage and answers questions directed to him but otherwise is disengaged.  Pt VS are WNL and NAD noted.  Pt is demented and has hx of prostate cx.

## 2016-08-27 NOTE — ED Notes (Signed)
Portable XR at bedside

## 2016-08-27 NOTE — ED Notes (Signed)
PT SON (TONY Un) [phone]. CALL ANYTIME. WILL BE BACK IN THE MORNING.

## 2016-08-28 DIAGNOSIS — R4182 Altered mental status, unspecified: Secondary | ICD-10-CM | POA: Diagnosis present

## 2016-08-28 LAB — BASIC METABOLIC PANEL
Anion gap: 7 (ref 5–15)
BUN: 22 mg/dL — ABNORMAL HIGH (ref 6–20)
CALCIUM: 8.4 mg/dL — AB (ref 8.9–10.3)
CHLORIDE: 103 mmol/L (ref 101–111)
CO2: 27 mmol/L (ref 22–32)
Creatinine, Ser: 0.7 mg/dL (ref 0.61–1.24)
GFR calc non Af Amer: >60 mL/min (ref >60)
GLUCOSE: 135 mg/dL — AB (ref 65–99)
POTASSIUM: 4.4 mmol/L (ref 3.5–5.1)
Sodium: 137 mmol/L (ref 135–145)

## 2016-08-28 LAB — CBC
HEMATOCRIT: 38.6 % — AB (ref 40.0–52.0)
HEMOGLOBIN: 13.3 g/dL (ref 13.0–18.0)
MCH: 31.5 pg (ref 26.0–34.0)
MCHC: 34.4 g/dL (ref 32.0–36.0)
MCV: 91.8 fL (ref 80.0–100.0)
Platelets: 207 10*3/uL (ref 150–440)
RBC: 4.2 MIL/uL — AB (ref 4.40–5.90)
RDW: 13.5 % (ref 11.5–14.5)
WBC: 10.3 10*3/uL (ref 3.8–10.6)

## 2016-08-28 LAB — GLUCOSE, CAPILLARY
GLUCOSE-CAPILLARY: 131 mg/dL — AB (ref 65–99)
GLUCOSE-CAPILLARY: 109 mg/dL — AB (ref 65–99)
GLUCOSE-CAPILLARY: 143 mg/dL — AB (ref 65–99)
Glucose-Capillary: 151 mg/dL — ABNORMAL HIGH (ref 65–99)
Glucose-Capillary: 115 mg/dL — ABNORMAL HIGH (ref 65–99)

## 2016-08-28 MED ORDER — ASPIRIN 81 MG PO CHEW
81.0000 mg | CHEWABLE_TABLET | Freq: Two times a day (BID) | ORAL | Status: DC
  Administered 2016-08-28 – 2016-08-31 (×6): 81 mg via ORAL
  Filled 2016-08-28 (×5): qty 1

## 2016-08-28 MED ORDER — BISACODYL 5 MG PO TBEC
5.0000 mg | DELAYED_RELEASE_TABLET | Freq: Every day | ORAL | Status: DC | PRN
  Administered 2016-08-29: 5 mg via ORAL
  Filled 2016-08-28 (×2): qty 1

## 2016-08-28 MED ORDER — ONDANSETRON HCL 4 MG PO TABS: 4.0000 mg | ORAL_TABLET | Freq: Four times a day (QID) | ORAL | Status: DC | PRN

## 2016-08-28 MED ORDER — SENNOSIDES-DOCUSATE SODIUM 8.6-50 MG PO TABS: 1.0000 | ORAL_TABLET | Freq: Every evening | ORAL | Status: DC | PRN

## 2016-08-28 MED ORDER — ATORVASTATIN CALCIUM 10 MG PO TABS
10.0000 mg | ORAL_TABLET | Freq: Every day | ORAL | Status: DC
  Administered 2016-08-29 – 2016-08-30 (×2): 10 mg via ORAL
  Filled 2016-08-28 (×2): qty 1

## 2016-08-28 MED ORDER — ACETAMINOPHEN 650 MG RE SUPP: 650.0000 mg | Freq: Four times a day (QID) | RECTAL | Status: DC | PRN

## 2016-08-28 MED ORDER — MAGNESIUM CITRATE PO SOLN
1.0000 | Freq: Once | ORAL | Status: DC | PRN
  Filled 2016-08-28: qty 296

## 2016-08-28 MED ORDER — ENOXAPARIN SODIUM 40 MG/0.4ML ~~LOC~~ SOLN
40.0000 mg | SUBCUTANEOUS | Status: DC
  Administered 2016-08-28 – 2016-08-31 (×4): 40 mg via SUBCUTANEOUS
  Filled 2016-08-28 (×4): qty 0.4

## 2016-08-28 MED ORDER — ONDANSETRON HCL 4 MG/2ML IJ SOLN: 4.0000 mg | Freq: Four times a day (QID) | INTRAMUSCULAR | Status: DC | PRN

## 2016-08-28 MED ORDER — TRAMADOL HCL 50 MG PO TABS
50.0000 mg | ORAL_TABLET | Freq: Three times a day (TID) | ORAL | Status: DC | PRN
  Administered 2016-08-28: 50 mg via ORAL
  Filled 2016-08-28: qty 1

## 2016-08-28 MED ORDER — DEXTROSE 5 % IV SOLN
1.0000 g | INTRAVENOUS | Status: DC
  Administered 2016-08-28 – 2016-08-29 (×2): 1 g via INTRAVENOUS
  Filled 2016-08-28 (×3): qty 10

## 2016-08-28 MED ORDER — ALBUTEROL SULFATE (2.5 MG/3ML) 0.083% IN NEBU: 2.5000 mg | INHALATION_SOLUTION | Freq: Four times a day (QID) | RESPIRATORY_TRACT | Status: DC | PRN

## 2016-08-28 MED ORDER — INSULIN ASPART 100 UNIT/ML ~~LOC~~ SOLN
0.0000 [IU] | Freq: Three times a day (TID) | SUBCUTANEOUS | Status: DC
  Administered 2016-08-28: 4 [IU] via SUBCUTANEOUS
  Administered 2016-08-29: 7 [IU] via SUBCUTANEOUS
  Administered 2016-08-29 (×2): 3 [IU] via SUBCUTANEOUS
  Administered 2016-08-30: 7 [IU] via SUBCUTANEOUS
  Administered 2016-08-30 (×2): 4 [IU] via SUBCUTANEOUS
  Administered 2016-08-31: 7 [IU] via SUBCUTANEOUS
  Administered 2016-08-31: 4 [IU] via SUBCUTANEOUS
  Filled 2016-08-28 (×9): qty 1

## 2016-08-28 MED ORDER — ACETAMINOPHEN 325 MG PO TABS: 650.0000 mg | ORAL_TABLET | Freq: Four times a day (QID) | ORAL | Status: DC | PRN

## 2016-08-28 MED ORDER — OXYCODONE HCL 5 MG PO TABS
5.0000 mg | ORAL_TABLET | ORAL | Status: DC | PRN
  Administered 2016-08-28 – 2016-08-29 (×3): 5 mg via ORAL
  Filled 2016-08-28 (×3): qty 1

## 2016-08-28 MED ORDER — INSULIN ASPART 100 UNIT/ML ~~LOC~~ SOLN: 0.0000 [IU] | Freq: Every day | SUBCUTANEOUS | Status: DC

## 2016-08-28 MED ORDER — CITALOPRAM HYDROBROMIDE 20 MG PO TABS
20.0000 mg | ORAL_TABLET | Freq: Every day | ORAL | Status: DC
  Administered 2016-08-28 – 2016-08-31 (×3): 20 mg via ORAL
  Filled 2016-08-28 (×3): qty 1

## 2016-08-28 MED ORDER — IPRATROPIUM BROMIDE 0.02 % IN SOLN: 0.5000 mg | Freq: Four times a day (QID) | RESPIRATORY_TRACT | Status: DC | PRN

## 2016-08-28 MED ORDER — SODIUM CHLORIDE 0.9 % IV SOLN
INTRAVENOUS | Status: DC
  Administered 2016-08-28: 15:00:00 via INTRAVENOUS
  Administered 2016-08-28: 75 mL/h via INTRAVENOUS
  Administered 2016-08-29: 14:00:00 via INTRAVENOUS

## 2016-08-28 NOTE — H&P (Signed)
History and Physical   [facility] - [place] @ [facility] Admission History and Physical [employer], D.O.    Patient Name: Albert Fritz MR#: [id number] Date of Birth: 11-May-1928 Date of Admission: 08/27/2016  Referring MD/NP/PA: Dr. Quentin Cornwall Primary Care Physician: Marguerita Merles, MD Chief Complaint:  Chief Complaint  Patient presents with  . Weakness  . Altered Mental Status  Please note the entire history is obtained from the patient's emergency department chart, emergency department provider. Patient's personal history is limited by dementia and altered mental status.   HPI: Albert Fritz is a 83 y.o. male with a known history of dementia, prostate cancer presents to the emergency department for evaluation of AMS.  Patient was in a usual state of health, Living at home alone until he was found by EMS to have generalized weakness, foul-smelling urine and confusion.  EMS/ED Course: Patient received Rocephin, NS. Medical admission was requested for ongoing management of altered mental status secondary to urinary tract infection  Review of Systems:  Unable to obtain secondary to AMS, dementia  Past Medical History:  Diagnosis Date  . Anxiety   . Arthritis   . BPH (benign prostatic hyperplasia)   . Cancer ([place])   . Cataract   . Dementia   . Diabetes mellitus without complication ([place])   . Elevated PSA   . Hearing loss   . History of kidney stones   . Hyperlipidemia   . Hypertension   . OA (osteoarthritis)   . Prostatitis   . Urinary retention   . UTI (lower urinary tract infection)     Past Surgical History:  Procedure Laterality Date  . BACK SURGERY    . BACK SURGERY    . CATARACT EXTRACTION    . HERNIA REPAIR       reports that he has quit smoking. His smoking use included Cigarettes. He has never used smokeless tobacco. He reports that he does not drink alcohol or use drugs.  No Known Allergies  Family History  Problem Relation Age of Onset  .  Throat cancer Father     Prior to Admission medications   Medication Sig Start Date End Date Taking? Authorizing Provider  acetaminophen (TYLENOL) 650 MG CR tablet Take 650 mg by mouth every 8 (eight) hours as needed for pain.   Yes [provider]  aspirin 81 MG chewable tablet Chew 81 mg by mouth 2 (two) times daily.    Yes [provider]  atorvastatin (LIPITOR) 10 MG tablet Take 10 mg by mouth daily.   Yes [provider]  citalopram (CELEXA) 20 MG tablet Take 1 tablet (20 mg total) by mouth daily. 07/29/16  Yes Cammie Sickle, MD  glipiZIDE (GLUCOTROL) 5 MG tablet Take 5 mg by mouth daily before breakfast. Reported on 07/24/2015   Yes [provider]  traMADol (ULTRAM) 50 MG tablet Take 1 tablet (50 mg total) by mouth every 8 (eight) hours as needed. 06/21/16  Yes Cammie Sickle, MD  Vitamin D, Ergocalciferol, (DRISDOL) 50000 units CAPS capsule Take 50,000 Units by mouth every 7 (seven) days.    Yes [provider]  XTANDI 40 MG capsule TAKE 4 CAPSULES (160 MG TOTAL) BY MOUTH ONCE DAILY AT THE SAME TIME. MAY TAKE WITH OR WITHOUT FOOD. SWALLOW WHOLE. Patient not taking: Reported on 07/29/2016 09/22/15   Cammie Sickle, MD    Physical Exam: Vitals:   08/27/16 2130 08/27/16 2230 08/27/16 2300 08/28/16 0020  BP: 135/83 (!) 160/82  118/65 (!) 150/80  Pulse: 83 80 86 84  Resp: 20 16 17 20   Temp:      TempSrc:      SpO2: 96% 100% 98% 99%    GENERAL: 83 y.o.-year-old male patient, well-developed, well-nourished lying in the bed in no acute distress.  Pleasantly confused  HEENT: Head atraumatic, normocephalic. Pupils equal, round, reactive to light and accommodation. No scleral icterus. Extraocular muscles intact. Nares are patent. Oropharynx is clear. Mucus membranes dry,. NECK: Supple, full range of motion. No JVD, no bruit heard. No thyroid enlargement, no tenderness, no cervical lymphadenopathy. CHEST: Normal breath sounds  bilaterally. No wheezing, rales, rhonchi or crackles. No use of accessory muscles of respiration.  No reproducible chest wall tenderness.  CARDIOVASCULAR: S1, S2 normal. No murmurs, rubs, or gallops. Cap refill <2 seconds. Pulses intact distally.  ABDOMEN: Soft, nondistended, nontender. No rebound, guarding, rigidity. Normoactive bowel sounds present in all four quadrants. No organomegaly or mass. EXTREMITIES: No pedal edema, cyanosis, or clubbing. No calf tenderness or Homan's sign.  NEUROLOGIC: The patient is alert and oriented x 3. Cranial nerves II through XII are grossly intact with no focal sensorimotor deficit. Muscle strength 5/5 in all extremities. Sensation intact. Gait not checked. PSYCHIATRIC:  Normal affect, mood, thought content. SKIN: Warm, dry, and intact without obvious rash, lesion, or ulcer.    Labs on Admission:  CBC:  Recent Labs Lab 08/27/16 2053  WBC 11.0*  HGB 13.5  HCT 39.4*  MCV 91.6  PLT 416   Basic Metabolic Panel:  Recent Labs Lab 08/27/16 2053  NA 137  K 4.6  CL 103  CO2 28  GLUCOSE 129*  BUN 26*  CREATININE 0.86  CALCIUM 8.6*   GFR: CrCl cannot be calculated (Unknown ideal weight.). Liver Function Tests: No results for input(s): AST, ALT, ALKPHOS, BILITOT, PROT, ALBUMIN in the last 168 hours. No results for input(s): LIPASE, AMYLASE in the last 168 hours. No results for input(s): AMMONIA in the last 168 hours. Coagulation Profile: No results for input(s): INR, PROTIME in the last 168 hours. Cardiac Enzymes: No results for input(s): CKTOTAL, CKMB, CKMBINDEX, TROPONINI in the last 168 hours. BNP (last 3 results) No results for input(s): PROBNP in the last 8760 hours. HbA1C: No results for input(s): HGBA1C in the last 72 hours. CBG: No results for input(s): GLUCAP in the last 168 hours. Lipid Profile: No results for input(s): CHOL, HDL, LDLCALC, TRIG, CHOLHDL, LDLDIRECT in the last 72 hours. Thyroid Function Tests: No results for  input(s): TSH, T4TOTAL, FREET4, T3FREE, THYROIDAB in the last 72 hours. Anemia Panel: No results for input(s): VITAMINB12, FOLATE, FERRITIN, TIBC, IRON, RETICCTPCT in the last 72 hours. Urine analysis:    Component Value Date/Time   COLORURINE YELLOW (A) 08/27/2016 2055   APPEARANCEUR CLEAR (A) 08/27/2016 2055   APPEARANCEUR Turbid 11/02/2013 0751   LABSPEC 1.018 08/27/2016 2055   LABSPEC 1.008 11/02/2013 0751   PHURINE 6.0 08/27/2016 2055   GLUCOSEU NEGATIVE 08/27/2016 2055   GLUCOSEU Negative 11/02/2013 0751   HGBUR NEGATIVE 08/27/2016 2055   BILIRUBINUR NEGATIVE 08/27/2016 2055   BILIRUBINUR Negative 11/02/2013 [place] 08/27/2016 2055   PROTEINUR NEGATIVE 08/27/2016 2055   NITRITE POSITIVE (A) 08/27/2016 2055   LEUKOCYTESUR SMALL (A) 08/27/2016 2055   LEUKOCYTESUR 3+ 11/02/2013 0751   Sepsis Labs: @LABRCNTIP (procalcitonin:4,lacticidven:4) )No results found for this or any previous visit (from the past 240 hour(s)).   Radiological Exams on Admission: Dg Chest Portable 1 View  Result Date: 08/27/2016  CLINICAL DATA:  co weakness as reported by family to EMS. Pt is normally ambulatory but today is unable to and also has decreased oral intake today EXAM: PORTABLE CHEST 1 VIEW COMPARISON:  09/05/2014.  Bone scan, Dec 16, 1930. FINDINGS: Cardiac silhouette is normal in size. No mediastinal or hilar masses. No convincing adenopathy. Clear lungs.  No pleural effusion or pneumothorax. Mild expansion is noted of the posterior left eighth rib, not present on the prior radiographs with no correlative increased activity on the prior bone scan. This may reflect a focus of metastatic disease or an old healed rib fracture. IMPRESSION: No acute cardiopulmonary disease. Electronically Signed   By: Lajean Manes [name].   On: 08/27/2016 21:56    EKG: Normal sinus rhythm at 80 bpm with leftward axis and nonspecific ST-T wave changes.   Assessment/Plan  This is a 83 y.o. male with a  history of dementia, HTN, HLD now being admitted with:  #. AMS 2/2 UTI Sepsis secondary to  - Admit to inpatient - IV antibiotics: Rocephin - IV fluid hydration - Follow up blood,urine cultures - Repeat CBC in am.  - Safety sitter for agitation  #. Dehydration - IVFs - Recheck BMP in AM  #. History of depression -Continue Celexa  #. History of hyperlipidemia - Continue Lipitor  #. History of Diabetes - Accuchecks achs with RISS coverage - Heart healthy, carb controlled diet - Hold glipizide  Admission status: Inpatient IV Fluids: NS Diet/Nutrition: Heart healthy, carb controlled Consults called: None  DVT Px: Lovenox, SCDs and early ambulation. Code Status: Full Code  Disposition Plan: To home in 2-3 days  All the records are reviewed and case discussed with ED provider. Management plans discussed with the patient and/or family who express understanding and agree with plan of care.  Daylene Vandenbosch D.O. on 08/28/2016 at 12:25 AM Between 7am to 6pm - Pager - [phone] After 6pm go to [email or web address] - [employer] [facility] [place] [facility] Office [phone] CC: Primary care physician; Marguerita Merles, MD   08/28/2016, 12:25 AM

## 2016-08-28 NOTE — Progress Notes (Signed)
[place] at [place] NAME: Albert Fritz    MR#:  [id number]  DATE OF BIRTH:  04-29-1928  SUBJECTIVE:  CHIEF COMPLAINT:  Patient is chronically demented, wife at bedside. Mumbling about dogs  REVIEW OF SYSTEMS:  Review of system unobtainable  DRUG ALLERGIES:  No Known Allergies  VITALS:  Blood pressure 133/71, pulse 82, temperature 98.7 F (37.1 C), temperature source Oral, resp. rate 19, height 6\' 2"  (1.88 m), weight 90.8 kg (200 lb 3.2 oz), SpO2 96 %.  PHYSICAL EXAMINATION:  GENERAL:  83 y.o.-year-old patient lying in the bed with no acute distress.  EYES: Pupils equal, round, reactive to light and accommodation. No scleral icterus.  HEENT: Head atraumatic, normocephalic. Oropharynx and nasopharynx clear.  NECK:  Supple, no jugular venous distention. No thyroid enlargement, no tenderness.  LUNGS: Normal breath sounds bilaterally, no wheezing, rales,rhonchi or crepitation. No use of accessory muscles of respiration.  CARDIOVASCULAR: S1, S2 normal. No murmurs, rubs, or gallops.  ABDOMEN: Soft, nontender, nondistended. Bowel sounds present. No organomegaly or mass.  EXTREMITIES: No pedal edema, cyanosis, or clubbing.  NEUROLOGIC: Chronically demented PSYCHIATRIC: The patient is alert and disoriented SKIN: No obvious rash, lesion, or ulcer.    LABORATORY PANEL:   CBC  Recent Labs Lab 08/28/16 0330  WBC 10.3  HGB 13.3  HCT 38.6*  PLT 207   ------------------------------------------------------------------------------------------------------------------  Chemistries   Recent Labs Lab 08/28/16 0330  NA 137  K 4.4  CL 103  CO2 27  GLUCOSE 135*  BUN 22*  CREATININE 0.70  CALCIUM 8.4*   ------------------------------------------------------------------------------------------------------------------  Cardiac Enzymes No results for input(s): TROPONINI in the last 168  hours. ------------------------------------------------------------------------------------------------------------------  RADIOLOGY:  Dg Chest Portable 1 View  Result Date: 08/27/2016 CLINICAL DATA:  co weakness as reported by family to EMS. Pt is normally ambulatory but today is unable to and also has decreased oral intake today EXAM: PORTABLE CHEST 1 VIEW COMPARISON:  09/05/2014.  Bone scan, January 23, 1931. FINDINGS: Cardiac silhouette is normal in size. No mediastinal or hilar masses. No convincing adenopathy. Clear lungs.  No pleural effusion or pneumothorax. Mild expansion is noted of the posterior left eighth rib, not present on the prior radiographs with no correlative increased activity on the prior bone scan. This may reflect a focus of metastatic disease or an old healed rib fracture. IMPRESSION: No acute cardiopulmonary disease. Electronically Signed   By: Lajean Manes [name].   On: 08/27/2016 21:56    EKG:   Orders placed or performed during the hospital encounter of 08/27/16  . ED EKG  . ED EKG  . EKG 12-Lead  . EKG 12-Lead  . EKG 12-Lead    ASSESSMENT AND PLAN:   This is a 83 y.o. male with a history of dementia, HTN, HLD now being admitted with:  #. AMS 2/2 UTI Continue IV antibiotics IV Rocephin and IV fluids - Follow up blood,urine cultures - Repeat CBC in am.  - Safety sitter for agitation  #. Dehydration -Continue IVFs - Recheck BMP in AM  #. History of depression -Continue Celexa  #. History of hyperlipidemia - Continue Lipitor  #. History of Diabetes - Accuchecks achs with RISS coverage - Heart healthy, carb controlled diet - Hold glipizide     All the records are reviewed and case discussed with Care Management/Social Workerr. Management plans discussed with the patient, family and they are in agreement.  CODE STATUS: fc   TOTAL TIME TAKING CARE OF THIS PATIENT:  35 minutes.   POSSIBLE D/C IN 2 DAYS, DEPENDING ON CLINICAL CONDITION.  Note:  This dictation was prepared with Dragon dictation along with smaller phrase technology. Any transcriptional errors that result from this process are unintentional.   Nicholes Mango [name] on 08/28/2016 at 11:15 AM  Between 7am to 6pm - Pager - [phone] After 6pm go to [email or web address] - password [email or web address] [facility]  [place] [facility]  Office  [phone]  CC: Primary care physician; Marguerita Merles, MD

## 2016-08-28 NOTE — ED Notes (Signed)
This tech safety sitting at bedside

## 2016-08-28 NOTE — ED Notes (Signed)
Pt transport to 144 

## 2016-08-28 NOTE — ED Notes (Signed)
Pt seen at the end of bed standing by this RN. Pt proceeded to loose footing and slip backwards. Pt caught about a foot off of the floor by this RN and assisted back into bed by Quentin Cornwall, MD and this nurse. Christina RN and Beth EDT at bedside moments later to redress pt and lift pt up in bed.

## 2016-08-28 NOTE — Progress Notes (Addendum)
New Admission Note:   Arrival Method: per stretcher from ED, pt came from home Mental Orientation: alert and disoriented X4, baseline demented, speech clear Telemetry: placed on telebox [id number], CCMD notified, verified with RN Neoma Laming Assessment: Completed Skin: warm, dry, with abrasions noted on both arms, with redness noted on the scrotum and sacrum. Prophylactic sacral foam dressing applied. IV: G20 on the left forearm with transparent dressing, intact, flushed. Pain: PAINAD score=0. Pt is relaxed and appears to be comfortable Safety Measures: Safety Fall Prevention Plan has been given, discussed and signed Admission: unable to complete, pt is confused. Will wait for the son to come in the morning to complete admission questions Family: no family member present at bedside however pt's son Nicole Kindred left a message that he will be back in the morning.  Orders have been reviewed and implemented. Call light has been placed within reach, awaiting for safety sitter at bedside, bed alarm has been activated. Will continue to monitor patient.   Georgeanna Harrison, RN [facility]

## 2016-08-28 NOTE — Progress Notes (Signed)
PT Cancellation Note  Patient Details Name: Albert Fritz MRN: [id number] DOB: 1928/03/20   Cancelled Treatment:    Reason Eval/Treat Not Completed: Patient's level of consciousness. Chart reviewed, case discussed with RN who reports patient continues to be smonolent and confused. [place] at bedside, pt sleeping. No family available to assist with PLOF or home setup. Will attempt again at later date/time when patient is better able to participate.   3:43 PM, 08/28/16 Etta Grandchild, PT, DPT Physical Therapist - [place] [phone])  [phone] (mobile)    Buccola,Allan C 08/28/2016, 3:43 PM

## 2016-08-29 DIAGNOSIS — I481 Persistent atrial fibrillation: Secondary | ICD-10-CM

## 2016-08-29 DIAGNOSIS — I442 Atrioventricular block, complete: Secondary | ICD-10-CM | POA: Diagnosis not present

## 2016-08-29 DIAGNOSIS — N390 Urinary tract infection, site not specified: Secondary | ICD-10-CM | POA: Diagnosis present

## 2016-08-29 LAB — BASIC METABOLIC PANEL
ANION GAP: 7 (ref 5–15)
BUN: 13 mg/dL (ref 6–20)
CO2: 25 mmol/L (ref 22–32)
Calcium: 8.3 mg/dL — ABNORMAL LOW (ref 8.9–10.3)
Chloride: 102 mmol/L (ref 101–111)
Creatinine, Ser: 0.42 mg/dL — ABNORMAL LOW (ref 0.61–1.24)
GFR calc Af Amer: >60 mL/min (ref >60)
GFR calc non Af Amer: >60 mL/min (ref >60)
GLUCOSE: 127 mg/dL — AB (ref 65–99)
POTASSIUM: 3.9 mmol/L (ref 3.5–5.1)
Sodium: 134 mmol/L — ABNORMAL LOW (ref 135–145)

## 2016-08-29 LAB — GLUCOSE, CAPILLARY
GLUCOSE-CAPILLARY: 140 mg/dL — AB (ref 65–99)
GLUCOSE-CAPILLARY: 143 mg/dL — AB (ref 65–99)
Glucose-Capillary: 145 mg/dL — ABNORMAL HIGH (ref 65–99)
Glucose-Capillary: 220 mg/dL — ABNORMAL HIGH (ref 65–99)

## 2016-08-29 LAB — MAGNESIUM: Magnesium: 2 mg/dL (ref 1.7–2.4)

## 2016-08-29 NOTE — Progress Notes (Signed)
[place] at [place] NAME: Albert Fritz    MR#:  [id number]  DATE OF BIRTH:  1928-08-23  SUBJECTIVE:  CHIEF COMPLAINT:  Patient is chronically demented,Sleeping comfortably today arousable to verbal commands  REVIEW OF SYSTEMS:  Review of system unobtainable  DRUG ALLERGIES:  No Known Allergies  VITALS:  Blood pressure (!) 157/83, pulse 80, temperature 97.6 F (36.4 C), temperature source Oral, resp. rate 20, height 6\' 2"  (1.88 m), weight 90.8 kg (200 lb 3.2 oz), SpO2 96 %.  PHYSICAL EXAMINATION:  GENERAL:  83 y.o.-year-old patient lying in the bed with no acute distress.  EYES: Pupils equal, round, reactive to light and accommodation. No scleral icterus.  HEENT: Head atraumatic, normocephalic. Oropharynx and nasopharynx clear.  NECK:  Supple, no jugular venous distention. No thyroid enlargement, no tenderness.  LUNGS: Normal breath sounds bilaterally, no wheezing, rales,rhonchi or crepitation. No use of accessory muscles of respiration.  CARDIOVASCULAR: S1, S2 normal. No murmurs, rubs, or gallops.  ABDOMEN: Soft, nontender, nondistended. Bowel sounds present. No organomegaly or mass.  EXTREMITIES: No pedal edema, cyanosis, or clubbing.  NEUROLOGIC: Chronically demented PSYCHIATRIC: The patient is alert and disoriented SKIN: No obvious rash, lesion, or ulcer.    LABORATORY PANEL:   CBC  Recent Labs Lab 08/28/16 0330  WBC 10.3  HGB 13.3  HCT 38.6*  PLT 207   ------------------------------------------------------------------------------------------------------------------  Chemistries   Recent Labs Lab 08/28/16 0330  NA 137  K 4.4  CL 103  CO2 27  GLUCOSE 135*  BUN 22*  CREATININE 0.70  CALCIUM 8.4*   ------------------------------------------------------------------------------------------------------------------  Cardiac Enzymes No results for input(s): TROPONINI in the last 168  hours. ------------------------------------------------------------------------------------------------------------------  RADIOLOGY:  Dg Chest Portable 1 View  Result Date: 08/27/2016 CLINICAL DATA:  co weakness as reported by family to EMS. Pt is normally ambulatory but today is unable to and also has decreased oral intake today EXAM: PORTABLE CHEST 1 VIEW COMPARISON:  09/05/2014.  Bone scan, 1930/06/21. FINDINGS: Cardiac silhouette is normal in size. No mediastinal or hilar masses. No convincing adenopathy. Clear lungs.  No pleural effusion or pneumothorax. Mild expansion is noted of the posterior left eighth rib, not present on the prior radiographs with no correlative increased activity on the prior bone scan. This may reflect a focus of metastatic disease or an old healed rib fracture. IMPRESSION: No acute cardiopulmonary disease. Electronically Signed   By: Lajean Manes [name].   On: 08/27/2016 21:56    EKG:   Orders placed or performed during the hospital encounter of 08/27/16  . ED EKG  . ED EKG  . EKG 12-Lead  . EKG 12-Lead  . EKG 12-Lead    ASSESSMENT AND PLAN:   This is a 83 y.o. male with a history of dementia, HTN, HLD now being admitted with:  #. AMS 2/2 UTI Continue IV antibiotics IV Rocephin and IV fluids - Blood cultures were not done  -Urine culture with gram-negative rods sensitive depending - Repeat CBC in am.  - Safety sitter for agitation  #Atrial flutter with pauses Patient is asymptomatic Avoid rate limiting drugs Not a candidate for permanent pacemaker Seen by cardiology who has recommended to discontinue telemetry monitoring as patient is asymptomatic at this time -Check electrolytes  #. Dehydration -Continue IVFs - Recheck BMP in AM  #. History of depression -Continue Celexa  #. History of hyperlipidemia - Continue Lipitor  #. History of Diabetes - Accuchecks achs with RISS coverage - Heart healthy,  carb controlled diet - Hold  glipizide     All the records are reviewed and case discussed with Care Management/Social Workerr. Management plans discussed with the patient, family and they are in agreement.  CODE STATUS: fc   TOTAL TIME TAKING CARE OF THIS PATIENT: 35 minutes.   POSSIBLE D/C IN 2 DAYS, DEPENDING ON CLINICAL CONDITION.  Note: This dictation was prepared with Dragon dictation along with smaller phrase technology. Any transcriptional errors that result from this process are unintentional.   Nicholes Mango [name] on 08/29/2016 at 1:04 PM  Between 7am to 6pm - Pager - [phone] After 6pm go to [email or web address] - password [email or web address] [facility]  [place] [facility]  Office  [phone]  CC: Primary care physician; Marguerita Merles, MD

## 2016-08-29 NOTE — Care Management CC44 (Signed)
Condition Code 44 Documentation Completed  Patient Details  Name: Albert Fritz MRN: [id number] Date of Birth: 04-Feb-1929   Condition Code 44 given:  Yes (moon code 71 notice given) Patient signature on Condition Code 44 notice:  Yes (Yes) Documentation of 2 MD's agreement:   yes Code 44 added to claim:   yes    Ival Bible, RN 08/29/2016, 3:34 PM

## 2016-08-29 NOTE — Progress Notes (Signed)
CCMD called to advise nurse that patient heart rate has been irregular, he has been having pauses and aflutter. Per Dr. Margaretmary Eddy this nurse put in cardiology consult to see patient.  Spoke to son who is POA. He would like to change patient's code status to DNR. Nurse to notify Dr.

## 2016-08-29 NOTE — Consult Note (Signed)
Cardiology Consultation:   Patient ID: Albert Fritz; [id number]; [birth year]   Admit date: 08/27/2016 Date of Consult: 08/29/2016  Primary Care Provider: Marguerita Merles, MD Primary Cardiologist: Dr. Ubaldo Glassing Primary Electrophysiologist:  None   Patient Profile:   Albert Fritz is a 83 y.o. male with a hx of Dementia, atrial fibrillation, who is being seen today for the evaluation of pauses at the request of Dr. Margaretmary Eddy.  History of Present Illness:   Mr. Bonsell is a pleasantly demented 83 year old man who is admitted to the hospital with altered mental status. The patient has a history of atrial fibrillation and prostate cancer. He lives in a nursing home. He has fairly severe dementia at baseline. His mental status was worsened and he was admitted after being felt to have urinary tract infection. On telemetry, the patient has had pauses of up to 2-1/2 seconds during the daytime. He appears to be asymptomatic with this. The patient is unable to answer any questions for me. Review of the chart demonstrates no history of syncope. No history of angina, and no shortness of breath. On cardiac monitoring, he has had positive up to 2-1/2 seconds. His underlying rhythm today his atrial flutter.  Past Medical History:  Diagnosis Date  . Anxiety   . Arthritis   . BPH (benign prostatic hyperplasia)   . Cancer ([place])   . Cataract   . Dementia   . Diabetes mellitus without complication ([place])   . Elevated PSA   . Hearing loss   . History of kidney stones   . Hyperlipidemia   . Hypertension   . OA (osteoarthritis)   . Prostatitis   . Urinary retention   . UTI (lower urinary tract infection)     Past Surgical History:  Procedure Laterality Date  . BACK SURGERY    . BACK SURGERY    . CATARACT EXTRACTION    . HERNIA REPAIR       Inpatient Medications: Scheduled Meds: . aspirin  81 mg Oral BID  . atorvastatin  10 mg Oral Daily  . citalopram  20 mg Oral Daily  . enoxaparin (LOVENOX)  injection  40 mg Subcutaneous Q24H  . insulin aspart  0-20 Units Subcutaneous TID WC  . insulin aspart  0-5 Units Subcutaneous QHS   Continuous Infusions: . sodium chloride 75 mL/hr at 08/28/16 1503  . cefTRIAXone (ROCEPHIN) 1 g IVPB Stopped (08/28/16 2053)   PRN Meds: acetaminophen **OR** acetaminophen, albuterol, bisacodyl, ipratropium, magnesium citrate, ondansetron **OR** ondansetron (ZOFRAN) IV, oxyCODONE, senna-docusate, traMADol  Allergies:   No Known Allergies  Social History:   Social History   Social History  . Marital status: Divorced    Spouse name: N/A  . Number of children: N/A  . Years of education: N/A   Occupational History  . Not on file.   Social History Main Topics  . Smoking status: Former Smoker    Types: Cigarettes  . Smokeless tobacco: Never Used     Comment: quit 30 years ago  . Alcohol use No  . Drug use: No  . Sexual activity: Yes    Partners: Male   Other Topics Concern  . Not on file   Social History Narrative  . No narrative on file    Family History:   The patient's family history includes Throat cancer in his father.  ROS:  Please see the history of present illness.  ROS  All other ROS Cannot be assessed due to his severe dementia and  altered mental status  Physical Exam/Data:   Vitals:   08/28/16 0737 08/28/16 1355 08/28/16 2327 08/29/16 0758  BP: 133/71 (!) 125/59 (!) 167/95 (!) 157/83  Pulse: 82 81 81 80  Resp: 19 18  20   Temp: 98.7 F (37.1 C) 99.2 F (37.3 C) 98.3 F (36.8 C) 97.6 F (36.4 C)  TempSrc: Oral Oral Oral Oral  SpO2: 96% 96%    Weight: 200 lb 3.2 oz (90.8 kg)     Height: 6\' 2"  (1.88 m)       Intake/Output Summary (Last 24 hours) at 08/29/16 1215 Last data filed at 08/29/16 0646  Gross per 24 hour  Intake           2157.5 ml  Output                0 ml  Net           2157.5 ml   Filed Weights   08/28/16 0737  Weight: 200 lb 3.2 oz (90.8 kg)   Body mass index is 25.7 kg/m.  General:  Well  nourished, well developed, in no acute distress, Sleeping comfortably HEENT: normal Lymph: no adenopathy Neck: 6 cm JVD Endocrine:  No thryomegaly Vascular: No carotid bruits; FA pulses 2+ bilaterally without bruits  Cardiac:  normal S1, S2; irregularly irregular; no murmur  Lungs:  clear to auscultation bilaterally, no wheezing, rhonchi or rales  Abd: soft, nontender, no hepatomegaly  Ext: no edema Musculoskeletal:  No deformities, BUE and BLE strength normal and equal Skin: warm and dry  Neuro:  The patient is sleeping but moves all extremities. Psych: Somnolent  EKG:  The EKG was personally reviewed and demonstrates:  Atrial fibrillation with a controlled ventricular response Telemetry:  Telemetry was personally reviewed and demonstrates:  Atrial fibrillation and flutter with a controlled ventricular response, a fast ventricular response, and the slow ventricular response, with pauses of 2-1/2 seconds  Relevant CV Studies: None  Laboratory Data:  Chemistry Recent Labs Lab 08/27/16 2053 08/28/16 0330  NA 137 137  K 4.6 4.4  CL 103 103  CO2 28 27  GLUCOSE 129* 135*  BUN 26* 22*  CREATININE 0.86 0.70  CALCIUM 8.6* 8.4*  GFRNONAA >60 >60  GFRAA >60 >60  ANIONGAP 6 7    No results for input(s): PROT, ALBUMIN, AST, ALT, ALKPHOS, BILITOT in the last 168 hours. Hematology Recent Labs Lab 08/27/16 2053 08/28/16 0330  WBC 11.0* 10.3  RBC 4.30* 4.20*  HGB 13.5 13.3  HCT 39.4* 38.6*  MCV 91.6 91.8  MCH 31.4 31.5  MCHC 34.3 34.4  RDW 13.3 13.5  PLT 218 207   Cardiac EnzymesNo results for input(s): TROPONINI in the last 168 hours. No results for input(s): TROPIPOC in the last 168 hours.  BNPNo results for input(s): BNP, PROBNP in the last 168 hours.  DDimer No results for input(s): DDIMER in the last 168 hours.  Radiology/Studies:  Dg Chest Portable 1 View  Result Date: 08/27/2016 CLINICAL DATA:  co weakness as reported by family to EMS. Pt is normally ambulatory  but today is unable to and also has decreased oral intake today EXAM: PORTABLE CHEST 1 VIEW COMPARISON:  09/05/2014.  Bone scan, May 21, 1930. FINDINGS: Cardiac silhouette is normal in size. No mediastinal or hilar masses. No convincing adenopathy. Clear lungs.  No pleural effusion or pneumothorax. Mild expansion is noted of the posterior left eighth rib, not present on the prior radiographs with no correlative increased activity on the prior bone  scan. This may reflect a focus of metastatic disease or an old healed rib fracture. IMPRESSION: No acute cardiopulmonary disease. Electronically Signed   By: Lajean Manes [name].   On: 08/27/2016 21:56    Assessment and Plan:   1. Atrial fibrillation/flutter with a variable ventricular response - he appears to be asymptomatic. I would consider discontinuation of telemetry. 2. Intermittent heart block - no evidence of symptoms. With his multiple comorbidities, no additional evaluation or recommendations other than avoiding AV nodal blocking drugs. Would also recommend keeping electrolytes within normal range. He is not a candidate for permanent pacemaker. Would consider discontinuation of telemetry. 3. Altered mental status - he is worsening dementia on top of baseline dementia. It is felt that he is an infection, and has been treated with cultures and intravenous antibiotics and fluid resuscitation. No additional recommendations at this time.   Signed, Cristopher Peru, MD  08/29/2016 12:15 PM

## 2016-08-29 NOTE — Progress Notes (Signed)
PT Cancellation Note  Patient Details Name: Albert Fritz MRN: [id number] DOB: 01-23-1929   Cancelled Treatment:    Reason Eval/Treat Not Completed: Patient's level of consciousness. PT attempted evaluation at 0825 with sitter present. Patient disoriented and unable to follow one step commands, moaning and only stating, "My stomach hurts" and requesting mittens be removed. Will check back later if time permits.   Dorice Lamas, PT, DPT 08/29/2016, 8:31 AM

## 2016-08-29 NOTE — Care Management CC44 (Signed)
Condition Code 44 Documentation Completed  Patient Details  Name: Albert Fritz MRN: [id number] Date of Birth: 20-Jun-1928   Condition Code 44 given:  Yes (moon code 63 notice given) Patient signature on Condition Code 44 notice:  Yes (Yes) Documentation of 2 MD's agreement:    Code 44 added to claim:       Samuel Mcpeek A, RN 08/29/2016, 3:32 PM

## 2016-08-29 NOTE — Care Management Obs Status (Signed)
[place] NOTIFICATION   Patient Details  Name: Albert Fritz MRN: [id number] Date of Birth: 03/03/1929   Medicare Observation Status Notification Given:  Yes [facility] letter given)    Mardene Speak, RN 08/29/2016, 3:31 PM

## 2016-08-30 LAB — URINE CULTURE: Culture: >=100000 — AB

## 2016-08-30 LAB — GLUCOSE, CAPILLARY
GLUCOSE-CAPILLARY: 180 mg/dL — AB (ref 65–99)
GLUCOSE-CAPILLARY: 151 mg/dL — AB (ref 65–99)
GLUCOSE-CAPILLARY: 165 mg/dL — AB (ref 65–99)
Glucose-Capillary: 231 mg/dL — ABNORMAL HIGH (ref 65–99)

## 2016-08-30 LAB — BASIC METABOLIC PANEL
Anion gap: 7 (ref 5–15)
BUN: 17 mg/dL (ref 6–20)
CALCIUM: 8.3 mg/dL — AB (ref 8.9–10.3)
CO2: 27 mmol/L (ref 22–32)
CREATININE: 0.68 mg/dL (ref 0.61–1.24)
Chloride: 101 mmol/L (ref 101–111)
GFR calc non Af Amer: >60 mL/min (ref >60)
Glucose, Bld: 179 mg/dL — ABNORMAL HIGH (ref 65–99)
Potassium: 3.9 mmol/L (ref 3.5–5.1)
SODIUM: 135 mmol/L (ref 135–145)

## 2016-08-30 LAB — CBC
HCT: 42.5 % (ref 40.0–52.0)
Hemoglobin: 14.2 g/dL (ref 13.0–18.0)
MCH: 30.6 pg (ref 26.0–34.0)
MCHC: 33.4 g/dL (ref 32.0–36.0)
MCV: 91.7 fL (ref 80.0–100.0)
PLATELETS: 213 10*3/uL (ref 150–440)
RBC: 4.63 MIL/uL (ref 4.40–5.90)
RDW: 13.2 % (ref 11.5–14.5)
WBC: 11.1 10*3/uL — AB (ref 3.8–10.6)

## 2016-08-30 MED ORDER — CEPHALEXIN 500 MG PO CAPS
500.0000 mg | ORAL_CAPSULE | Freq: Three times a day (TID) | ORAL | Status: DC
  Administered 2016-08-30 – 2016-08-31 (×4): 500 mg via ORAL
  Filled 2016-08-30 (×4): qty 1

## 2016-08-30 MED ORDER — BISACODYL 10 MG RE SUPP: 10.0000 mg | Freq: Every day | RECTAL | Status: DC

## 2016-08-30 NOTE — Evaluation (Signed)
Physical Therapy Evaluation Patient Details Name: Albert Fritz MRN: [id number] DOB: 12/23/1928 Today's Date: 08/30/2016   History of Present Illness  Emmanuel Gruenhagen is a 83 y.o. male with a known history of dementia and prostate cancer who presented to the emergency department for evaluation of AMS.  Patient was in a usual state of health, living at home with his girlfriend until he was found by EMS to have generalized weakness, foul-smelling urine and confusion. Pt is AOx1 at time of PT evaluation and unable to provide any meaningful history. Information obtained from medical record and care manager who spoke with patient's son. Per care manager pt was receiving assistance from his girlfriend for his care. He was ambulatory with a rolling walker at baseline.   Clinical Impression  Pt admitted with above diagnosis. Pt currently with functional limitations due to the deficits listed below (see PT Problem List).   Pt is AOx1 at time of PT evaluation and unable to provide any meaningful history. He is thoroughly confused throughout evaluation and unsafe with mobility. He requires maxA to get upright to EOB and modA+2 for transfers. He is able to take a few small steps forward/backward to the sink but is unable/unsafe to walk farther at this time. Pt requiring continual assistance for balance in standing. Very poor safety awareness. He will need SNF placement at discharge in order to facilitate safe return to prior level of function. Pt will benefit from skilled PT services to address deficits in strength, balance, and mobility in order to return to full function at home.      Follow Up Recommendations SNF    Equipment Recommendations  Other (comment) (Unable to determine at this time)    Recommendations for Other Services       Precautions / Restrictions Precautions Precautions: Fall Restrictions Weight Bearing Restrictions: No      Mobility  Bed Mobility Overal bed mobility: Needs  Assistance Bed Mobility: Supine to Sit;Sit to Supine     Supine to sit: Max assist Sit to supine: Mod assist   General bed mobility comments: Heavy cues and hand over hand assist for bed mobility. Poor sequencing. Overhead trapeze required to scoot up toward [facility]  Transfers Overall transfer level: Needs assistance Equipment used: Rolling walker (2 wheeled) Transfers: Sit to/from Stand Sit to Stand: Mod assist;+2 physical assistance            Ambulation/Gait Ambulation/Gait assistance: Min assist;+2 physical assistance Ambulation Distance (Feet): 5 Feet Assistive device: Rolling walker (2 wheeled) Gait Pattern/deviations: Decreased step length - right;Decreased step length - left Gait velocity: Decreased Gait velocity interpretation: <1.8 ft/sec, indicative of risk for recurrent falls General Gait Details: Pt able to take a few steps forward/backward at EOB with rolling walker. He is unsteady with very poor safety awareness. Pt requires assist to advance walker during both phases of ambulation. Heavy UE use on rolling walker and constant support from therapist for balance  Stairs            Wheelchair Mobility    Modified Rankin (Stroke Patients Only)       Balance Overall balance assessment: Needs assistance Sitting-balance support: No upper extremity supported Sitting balance-Leahy Scale: Good     Standing balance support: Bilateral upper extremity supported Standing balance-Leahy Scale: Poor Standing balance comment: Pt requires continual support to remain upright in standing  Pertinent Vitals/Pain Pain Assessment: Faces Faces Pain Scale: No hurt    Home Living Family/patient expects to be discharged to:: Skilled nursing facility Living Arrangements: Spouse/significant other               Additional Comments: Per care management not family is seeking placement. No information available regarding current home  accessiblity    Prior Function Level of Independence: Needs assistance         Comments: Pt AOx1 and unable to provide information. Per care manager pt was receiving assistance from his girlfriend for ADLs/IADLs. He was ambulatory with a rolling walker at baseline.      Hand Dominance        Extremity/Trunk Assessment   Upper Extremity Assessment Upper Extremity Assessment: Generalized weakness    Lower Extremity Assessment Lower Extremity Assessment: Generalized weakness       Communication   Communication: Other (comment) (Confused and mumbling)  Cognition Arousal/Alertness: Lethargic Behavior During Therapy: WFL for tasks assessed/performed Overall Cognitive Status: History of cognitive impairments - at baseline                                 General Comments: Per medical record pt has dementia. However his current status is declined from his baseline per records. Pt is AOx1 at time of PT evaluation. Unable to provide DOB.       General Comments      Exercises     Assessment/Plan    PT Assessment Patient needs continued PT services  PT Problem List Decreased strength;Decreased balance;Decreased cognition;Decreased safety awareness       PT Treatment Interventions DME instruction;Gait training;Stair training;Functional mobility training;Therapeutic activities;Therapeutic exercise;Balance training;Neuromuscular re-education;Cognitive remediation;Patient/family education    PT Goals (Current goals can be found in the Care Plan section)  Acute Rehab PT Goals PT Goal Formulation: Patient unable to participate in goal setting    Frequency Min 2X/week   Barriers to discharge Decreased caregiver support Lives with girlfriend who is also in poor health    Co-evaluation               AM-PAC PT "6 Clicks" Daily Activity  Outcome Measure Difficulty turning over in bed (including adjusting bedclothes, sheets and blankets)?: Total Difficulty  moving from lying on back to sitting on the side of the bed? : Total Difficulty sitting down on and standing up from a chair with arms (e.g., wheelchair, bedside commode, etc,.)?: Total Help needed moving to and from a bed to chair (including a wheelchair)?: A Lot Help needed walking in hospital room?: A Lot Help needed climbing 3-5 steps with a railing? : A Lot 6 Click Score: 9    End of Session Equipment Utilized During Treatment: Gait belt Activity Tolerance: Patient tolerated treatment well Patient left: in bed;with call bell/phone within reach;with nursing/sitter in room   PT Visit Diagnosis: Unsteadiness on feet (R26.81);Muscle weakness (generalized) (M62.81)    Time: 2831-5176 PT Time Calculation (min) (ACUTE ONLY): 14 min   Charges:   PT Evaluation $PT Eval Low Complexity: 1 Procedure     PT G Codes:   PT G-Codes **NOT FOR INPATIENT CLASS** Functional Assessment Tool Used: AM-PAC 6 Clicks Basic Mobility Functional Limitation: Mobility: Walking and moving around Mobility: Walking and Moving Around Current Status (H6073): At least 60 percent but less than 80 percent impaired, limited or restricted Mobility: Walking and Moving Around Goal Status [phone]): At least 20 percent but  less than 40 percent impaired, limited or restricted    Phillips Grout PT, DPT   Huprich,Jason 08/30/2016, 10:50 AM

## 2016-08-30 NOTE — Care Management (Signed)
TC to patients son, Orlandus Borowski. Per son, patient lives with [name], girlfriend. Per son, she is unable to care for patient any longer due to his increased needs. She is 41 and not well herself.  He uses a walker at baseline. PCP is Dr. Sabra Heck. Son would like placement. Explained PT would have to evaluate patient and based on their recommendations RNCM or SW will follow up with him regarding options. He is agreeable to POC.

## 2016-08-30 NOTE — Clinical Social Work Placement (Signed)
   CLINICAL SOCIAL WORK PLACEMENT  NOTE  Date:  08/30/2016  Patient Details  Name: Albert Fritz MRN: [id number] Date of Birth: 10-26-1928  Clinical Social Work is seeking post-discharge placement for this patient at the [place] level of care (*CSW will initial, date and re-position this form in  chart as items are completed):  Yes   Patient/family provided with [facility] Work Department's list of facilities offering this level of care within the geographic area requested by the patient (or if unable, by the patient's family).  Yes   Patient/family informed of their freedom to choose among providers that offer the needed level of care, that participate in Medicare, Medicaid or managed care program needed by the patient, have an available bed and are willing to accept the patient.  Yes   Patient/family informed of [facility]'s ownership interest in [facility] and [facility], as well as of the fact that they are under no obligation to receive care at these facilities.  PASRR submitted to EDS on 08/30/16     PASRR number received on 08/30/16     Existing PASRR number confirmed on       FL2 transmitted to all facilities in geographic area requested by pt/family on 08/30/16     FL2 transmitted to all facilities within larger geographic area on       Patient informed that his/her managed care company has contracts with or will negotiate with certain facilities, including the following:            Patient/family informed of bed offers received.  Patient chooses bed at       Physician recommends and patient chooses bed at      Patient to be transferred to   on  .  Patient to be transferred to facility by       Patient family notified on   of transfer.  Name of family member notified:        PHYSICIAN       Additional Comment:    _______________________________________________ Rhen Dossantos, Veronia Beets, LCSW 08/30/2016, 6:08 PM

## 2016-08-30 NOTE — NC FL2 (Addendum)
[place] LEVEL OF CARE SCREENING TOOL     IDENTIFICATION  Patient Name: Albert Fritz Birthdate: [birth year] Sex: male Admission Date (Current Location): 08/27/2016  [place] and [place] Number:  Engineering geologist and Address:  [facility], [address], [place], [place] [address]      Provider Number: [id number]  Attending Physician Name and Address:  Nicholes Mango, MD  Relative Name and Phone Number:       Current Level of Care: Hospital Recommended Level of Care: [place] Prior Approval Number:    Date Approved/Denied:   PASRR Number:  [id number])  Discharge Plan: SNF    Current Diagnoses: Patient Active Problem List   Diagnosis Date Noted  . UTI (urinary tract infection) 08/29/2016  . Altered mental status 08/28/2016  . Chronic fatigue 07/21/2016  . Falls frequently 07/21/2016  . Neck pain, bilateral 10/23/2015  . Fall 08/25/2015  . Pain in lower back 08/25/2015  . Hyponatremia 08/25/2015  . Financial difficulties 08/25/2015  . Prostate cancer metastatic to bone ([place]) 12/31/2014  . Elevated PSA 12/05/2014  . Urinary retention 12/05/2014  . BPH (benign prostatic hypertrophy) with urinary retention 12/05/2014  . Essential (primary) hypertension 07/11/2014  . Atrial flutter ([place]) 07/11/2014  . Hypertension     Orientation RESPIRATION BLADDER Height & Weight     Self  Normal Incontinent Weight: 200 lb 3.2 oz (90.8 kg) Height:  6\' 2"  (188 cm)  BEHAVIORAL SYMPTOMS/MOOD NEUROLOGICAL BOWEL NUTRITION STATUS   (none)  (none) Incontinent Diet (Diet: Heart Healthy/ Carb Modified )  AMBULATORY STATUS COMMUNICATION OF NEEDS Skin   Extensive Assist Verbally Normal                       Personal Care Assistance Level of Assistance  Bathing, Feeding, Dressing Bathing Assistance: Limited assistance Feeding assistance: Independent Dressing Assistance: Limited assistance     Functional Limitations  Info  Sight, Hearing, Speech Sight Info: Adequate Hearing Info: Impaired Speech Info: Adequate    SPECIAL CARE FACTORS FREQUENCY  PT (By licensed PT), OT (By licensed OT)     PT Frequency:  (5) OT Frequency:  (5)            Contractures      Additional Factors Info  Code Status, Allergies, Insulin Sliding Scale Code Status Info:  (Full Code. ) Allergies Info:  (No Known Allergies. )   Insulin Sliding Scale Info:  (NovoLog Insulin Injections. )       Current Medications (08/30/2016):  This is the current hospital active medication list Current Facility-Administered Medications  Medication Dose Route Frequency Provider Last Rate Last Dose  . 0.9 %  sodium chloride infusion   Intravenous Continuous Hugelmeyer, Alexis, DO 75 mL/hr at 08/29/16 1402    . acetaminophen (TYLENOL) tablet 650 mg  650 mg Oral Q6H PRN Hugelmeyer, Alexis, DO       Or  . acetaminophen (TYLENOL) suppository 650 mg  650 mg Rectal Q6H PRN Hugelmeyer, Alexis, DO      . albuterol (PROVENTIL) (2.5 MG/3ML) 0.083% nebulizer solution 2.5 mg  2.5 mg Nebulization Q6H PRN Hugelmeyer, Alexis, DO      . aspirin chewable tablet 81 mg  81 mg Oral BID Hugelmeyer, Alexis, DO   81 mg at 08/30/16 0907  . atorvastatin (LIPITOR) tablet 10 mg  10 mg Oral Daily Hugelmeyer, Alexis, DO   10 mg at 08/30/16 1743  . bisacodyl (DULCOLAX) EC tablet 5 mg  5 mg Oral Daily PRN Hugelmeyer, Alexis, DO   5 mg at 08/29/16 1647  . cephALEXin (KEFLEX) capsule 500 mg  500 mg Oral Q8H Jebidiah Baggerly, MD   500 mg at 08/30/16 1418  . citalopram (CELEXA) tablet 20 mg  20 mg Oral Daily Hugelmeyer, Alexis, DO   20 mg at 08/30/16 0907  . enoxaparin (LOVENOX) injection 40 mg  40 mg Subcutaneous Q24H Hugelmeyer, Alexis, DO   40 mg at 08/30/16 0907  . insulin aspart (novoLOG) injection 0-20 Units  0-20 Units Subcutaneous TID WC Hugelmeyer, Alexis, DO   4 Units at 08/30/16 1730  . insulin aspart (novoLOG) injection 0-5 Units  0-5 Units Subcutaneous QHS  Hugelmeyer, Alexis, DO      . ipratropium (ATROVENT) nebulizer solution 0.5 mg  0.5 mg Nebulization Q6H PRN Hugelmeyer, Alexis, DO      . magnesium citrate solution 1 Bottle  1 Bottle Oral Once PRN Hugelmeyer, Alexis, DO      . ondansetron (ZOFRAN) tablet 4 mg  4 mg Oral Q6H PRN Hugelmeyer, Alexis, DO       Or  . ondansetron (ZOFRAN) injection 4 mg  4 mg Intravenous Q6H PRN Hugelmeyer, Alexis, DO      . oxyCODONE (Oxy IR/ROXICODONE) immediate release tablet 5 mg  5 mg Oral Q4H PRN Hugelmeyer, Alexis, DO   5 mg at 08/29/16 2021  . senna-docusate (Senokot-S) tablet 1 tablet  1 tablet Oral QHS PRN Hugelmeyer, Alexis, DO      . traMADol (ULTRAM) tablet 50 mg  50 mg Oral Q8H PRN Hugelmeyer, Alexis, DO   50 mg at 08/28/16 1111     Discharge Medications: Please see discharge summary for a list of discharge medications.  Relevant Imaging Results:  Relevant Lab Results:   Additional Information  (SSN: [id number])  Sample, Veronia Beets, LCSW

## 2016-08-30 NOTE — Progress Notes (Signed)
[place] at [place] NAME: Albert Fritz    MR#:  [id number]  DATE OF BIRTH:  [birth year]  SUBJECTIVE:  CHIEF COMPLAINT:  Patient is chronically demented,Sleeping comfortably today arousable to verbal commands  REVIEW OF SYSTEMS:  Review of system unobtainable  DRUG ALLERGIES:  No Known Allergies  VITALS:  Blood pressure (!) 160/83, pulse 83, temperature 98.4 F (36.9 C), temperature source Oral, resp. rate 16, height 6\' 2"  (1.88 m), weight 90.8 kg (200 lb 3.2 oz), SpO2 100 %.  PHYSICAL EXAMINATION:  GENERAL:  83 y.o.-year-old patient lying in the bed with no acute distress.  EYES: Pupils equal, round, reactive to light and accommodation. No scleral icterus.  HEENT: Head atraumatic, normocephalic. Oropharynx and nasopharynx clear.  NECK:  Supple, no jugular venous distention. No thyroid enlargement, no tenderness.  LUNGS: Normal breath sounds bilaterally, no wheezing, rales,rhonchi or crepitation. No use of accessory muscles of respiration.  CARDIOVASCULAR: S1, S2 normal. No murmurs, rubs, or gallops.  ABDOMEN: Soft, nontender, nondistended. Bowel sounds present. No organomegaly or mass.  EXTREMITIES: No pedal edema, cyanosis, or clubbing.  NEUROLOGIC: Chronically demented PSYCHIATRIC: The patient is alert and disoriented SKIN: No obvious rash, lesion, or ulcer.    LABORATORY PANEL:   CBC  Recent Labs Lab 08/30/16 0346  WBC 11.1*  HGB 14.2  HCT 42.5  PLT 213   ------------------------------------------------------------------------------------------------------------------  Chemistries   Recent Labs Lab 08/29/16 1429 08/30/16 0346  NA 134* 135  K 3.9 3.9  CL 102 101  CO2 25 27  GLUCOSE 127* 179*  BUN 13 17  CREATININE 0.42* 0.68  CALCIUM 8.3* 8.3*  MG 2.0  --    ------------------------------------------------------------------------------------------------------------------  Cardiac Enzymes No results  for input(s): TROPONINI in the last 168 hours. ------------------------------------------------------------------------------------------------------------------  RADIOLOGY:  No results found.  EKG:   Orders placed or performed during the hospital encounter of 08/27/16  . ED EKG  . ED EKG  . EKG 12-Lead  . EKG 12-Lead  . EKG 12-Lead    ASSESSMENT AND PLAN:   This is a 83 y.o. male with a history of dementia, HTN, HLD now being admitted with:  #. AMS 2/2 UTI Given IV antibiotics IV Rocephin and IV fluids, change to by mouth Keflex - Blood cultures were not done  -Urine culture with gram-negative rods Escherichia coli, pansensitive - Repeat CBC in am.  - Safety sitter for agitation discontinued today  #Atrial flutter with pauses Patient is asymptomatic Avoid rate limiting drugs Not a candidate for permanent pacemaker Seen by cardiology who has recommended to discontinue telemetry monitoring as patient is asymptomatic at this time -Check electrolytes  #. Dehydration -Provided IVFs -  #. History of depression -Continue Celexa  #. History of hyperlipidemia - Continue Lipitor  #. History of Diabetes - Accuchecks achs with RISS coverage - Heart healthy, carb controlled diet - Hold glipizide   PT has recommended skilled nursing facility, awaiting bowel movement today anticipate discharge tomorrow follow-up with social worker  All the records are reviewed and case discussed with Care Management/Social Workerr. Management plans discussed with the patient's son Nicole Kindred and they are in agreement.  CODE STATUS: fc   TOTAL TIME TAKING CARE OF THIS PATIENT: 35 minutes.   POSSIBLE D/C IN 2 DAYS, DEPENDING ON CLINICAL CONDITION.  Note: This dictation was prepared with Dragon dictation along with smaller phrase technology. Any transcriptional errors that result from this process are unintentional.   Nicholes Mango [name] on 08/30/2016 at  4:27 PM  Between 7am to 6pm - Pager  - [phone] After 6pm go to [email or web address] - password [email or web address] [facility]  [facility]  Office  [phone]  CC: Primary care physician; Marguerita Merles, MD

## 2016-08-30 NOTE — Clinical Social Work Note (Signed)
Clinical Social Work Assessment  Patient Details  Name: Albert Fritz MRN: [id number] Date of Birth: 1928/08/16  Date of referral:  08/30/16               Reason for consult:  Facility Placement                Permission sought to share information with:  Chartered certified accountant granted to share information::  Yes, Verbal Permission Granted  Name::      [place]::   [place]   Relationship::     Contact Information:     Housing/Transportation Living arrangements for the past 2 months:  [place] of Information:  Adult Children Patient Interpreter Needed:  None Criminal Activity/Legal Involvement Pertinent to Current Situation/Hospitalization:  No - Comment as needed Significant Relationships:  Adult Children Lives with:  Significant Other Do you feel safe going back to the place where you live?    Need for family participation in patient care:  Yes (Comment)  Care giving concerns:  Patient lives in [place] with his significant other Albert Fritz.     Social Worker assessment / plan:  Holiday representative (CSW) received verbal consult from RN case manager that PT is recommending SNF. Per chart patient is not alert and oriented. CSW contacted patient's son Albert Fritz. Per son patient lives with his girlfriend in [place]. CSW explained SNF process and that [facility] requires authorization. Son is agreeable to SNF search in [place]. FL2 complete and faxed out. CSW started [facility] SNF authorization via [facility]. CSW will continue to follow and assist as needed.   Employment status:  Retired Nurse, adult PT Recommendations:  [place] / Referral to community resources:  [place]  Patient/Family's Response to care:  Patient's son is agreeable to [facility] in [place].   Patient/Family's Understanding of and Emotional Response to  Diagnosis, Current Treatment, and Prognosis:  Patient's son was very pleasant and thanked CSW for calling.   Emotional Assessment Appearance:  Appears stated age Attitude/Demeanor/Rapport:  Unable to Assess Affect (typically observed):  Unable to Assess Orientation:  Oriented to Self, Fluctuating Orientation (Suspected and/or reported Sundowners) Alcohol / Substance use:  Not Applicable Psych involvement (Current and /or in the community):  No (Comment)  Discharge Needs  Concerns to be addressed:  Discharge Planning Concerns Readmission within the last 30 days:  No Current discharge risk:  Dependent with Mobility Barriers to Discharge:  Continued Medical Work up   [employer], Veronia Beets, LCSW 08/30/2016, 6:09 PM

## 2016-08-31 LAB — GLUCOSE, CAPILLARY
GLUCOSE-CAPILLARY: 170 mg/dL — AB (ref 65–99)
GLUCOSE-CAPILLARY: 207 mg/dL — AB (ref 65–99)

## 2016-08-31 MED ORDER — INSULIN ASPART 100 UNIT/ML ~~LOC~~ SOLN: 0.0000 [IU] | Freq: Three times a day (TID) | SUBCUTANEOUS | 11 refills | Status: AC

## 2016-08-31 MED ORDER — SENNOSIDES-DOCUSATE SODIUM 8.6-50 MG PO TABS: 1.0000 | ORAL_TABLET | Freq: Every evening | ORAL | 0 refills | Status: AC | PRN

## 2016-08-31 MED ORDER — CEPHALEXIN 500 MG PO CAPS: 500.0000 mg | ORAL_CAPSULE | Freq: Three times a day (TID) | ORAL | 0 refills | Status: AC

## 2016-08-31 NOTE — Evaluation (Signed)
Clinical/Bedside Swallow Evaluation Patient Details  Name: Montravious Weigelt Haik MRN: [id number] Date of Birth: [birth year]  Today's Date: 08/31/2016 Time: SLP Start Time (ACUTE ONLY): 1010 SLP Stop Time (ACUTE ONLY): 1039 SLP Time Calculation (min) (ACUTE ONLY): 29 min  Past Medical History:  Past Medical History:  Diagnosis Date  . Anxiety   . Arthritis   . BPH (benign prostatic hyperplasia)   . Cancer ([place])   . Cataract   . Dementia   . Diabetes mellitus without complication ([place])   . Elevated PSA   . Hearing loss   . History of kidney stones   . Hyperlipidemia   . Hypertension   . OA (osteoarthritis)   . Prostatitis   . Urinary retention   . UTI (lower urinary tract infection)    Past Surgical History:  Past Surgical History:  Procedure Laterality Date  . BACK SURGERY    . BACK SURGERY    . CATARACT EXTRACTION    . HERNIA REPAIR     HPI: Per admitting history and physical:   Danyl Deems is a 83 y.o. male with a known history of dementia, prostate cancer presents to the emergency department for evaluation of AMS.  Patient was in a usual state of health, Living at home alone until he was found by EMS to have generalized weakness, foul-smelling urine and confusion.   Assessment / Plan / Recommendation Clinical Impression  Pt presents with functional swallowing at bedside with overt s/s of aspiration. Nsg previoulsy noted some swallowing difficulty but was unsure if iot was related to nasue and not wanting to swallow foods. Today, Pt was requesting water upon ST entering. Oral mech exam revealed structures to be functioning adequately with no apparent weakness. Pt has top dentures only. No s./s of aspiration with thin liquids, purees or solids. Pt needed extended time to masticate dry solids as he only has top dentures but he was able to clear oral cavity adequately.  Vocal quality remained clear throughout assessment, laryngeal elevation appeared adequate. Pt was very hngry and  ate ALL of the items ST brought in. No indication of nausea today. Rec continue with heart healthy diet. if Pt has any diffculty masticating the solids, will downgrade to dys 3. Pt noted to be coughing hard jsut prior to assessment and again upon completion of assessment, which does not appear to be related to aspiration or swallowing difficulty. Pt will need set up and reminders to eat and drink slowly, as he may be somewhat impulsive with meals.  SLP Visit Diagnosis: Dysphagia, oropharyngeal phase (R13.12)    Aspiration Risk  Mild aspiration risk    Diet Recommendation Regular   Liquid Administration via: Straw Medication Administration: Other (Comment) (Nsg reports Pt has been chewing medication. Crush as needed.) Supervision: Intermittent supervision to cue for compensatory strategies Compensations: Slow rate;Small sips/bites Postural Changes: Seated upright at 90 degrees;Remain upright for at least 30 minutes after po intake    Other  Recommendations Oral Care Recommendations: Oral care BID   Follow up Recommendations Skilled Nursing facility      Frequency and Duration    1 week       Prognosis Prognosis for Safe Diet Advancement: Good      Swallow Study   General Type of Study: Bedside Swallow Evaluation Diet Prior to this Study: Regular Temperature Spikes Noted: No Respiratory Status: Room air Behavior/Cognition: Alert;Cooperative;Pleasant mood Oral Cavity Assessment: Within Functional Limits Oral Care Completed by SLP: No Oral Cavity - Dentition: Dentures, top  Vision: Functional for self-feeding Self-Feeding Abilities: Needs set up;Able to feed self Patient Positioning: Upright in bed Baseline Vocal Quality: Normal Volitional Cough: Strong    Oral/Motor/Sensory Function Overall Oral Motor/Sensory Function: Within functional limits   Ice Chips Ice chips: Within functional limits   Thin Liquid Thin Liquid: Within functional limits Presentation: Straw    Nectar  Thick Nectar Thick Liquid: Not tested   Honey Thick Honey Thick Liquid: Not tested   Puree Puree: Within functional limits Presentation: Self Fed;Spoon   Solid   GO   Solid: Within functional limits Oral Phase Impairments: Other (comment) (extended time needed secondary to no bottom teeth) Oral Phase Functional Implications: Impaired mastication    Functional Assessment Tool Used: bedside swallow eval Functional Limitations: Swallowing Swallow Current Status (S8979): At least 1 percent but less than 20 percent impaired, limited or restricted Swallow Goal Status [phone]): At least 1 percent but less than 20 percent impaired, limited or restricted   Lucila Maine 08/31/2016,10:41 AM

## 2016-08-31 NOTE — Clinical Social Work Placement (Signed)
   CLINICAL SOCIAL WORK PLACEMENT  NOTE  Date:  08/31/2016  Patient Details  Name: Albert Fritz MRN: [id number] Date of Birth: [birth year]  Clinical Social Work is seeking post-discharge placement for this patient at the [place] level of care (*CSW will initial, date and re-position this form in  chart as items are completed):  Yes   Patient/family provided with [place] Work Department's list of facilities offering this level of care within the geographic area requested by the patient (or if unable, by the patient's family).  Yes   Patient/family informed of their freedom to choose among providers that offer the needed level of care, that participate in Medicare, Medicaid or managed care program needed by the patient, have an available bed and are willing to accept the patient.  Yes   Patient/family informed of [place] [facility]'s ownership interest in [facility] and [facility], as well as of the fact that they are under no obligation to receive care at these facilities.  PASRR submitted to EDS on 08/30/16     PASRR number received on 08/30/16     Existing PASRR number confirmed on       FL2 transmitted to all facilities in geographic area requested by pt/family on 08/30/16     FL2 transmitted to all facilities within larger geographic area on       Patient informed that his/her managed care company has contracts with or will negotiate with certain facilities, including the following:        Yes   Patient/family informed of bed offers received.  Patient chooses bed at  [facility] )     Physician recommends and patient chooses bed at      Patient to be transferred to  [facility] ) on 08/31/16.  Patient to be transferred to facility by  [facility] )     Patient family notified on 08/31/16 of transfer.  Name of family member notified:   (Patient's son Albert Fritz is aware of D/C today. )     PHYSICIAN        Additional Comment:    _______________________________________________ Gannon Heinzman, Veronia Beets, LCSW 08/31/2016, 3:05 PM

## 2016-08-31 NOTE — Progress Notes (Signed)
Clinical Social Worker (CSW) contacted patient's son Harrold Fitchett and presented bed offers. Son chose [facility]. Patient is medically stable for D/C to D/C to [facility] today room 29-A. [facility] SNF authorization has been received through [facility], auth # [id number], RVB, recommended 547 therapy hours per week. RN will call report and arrange EMS for transport. CSW sent D/C orders to [facility]. Patient's son Nicole Kindred is aware of above. Please reconsult if future social work needs arise. CSW signing off.   [employer], LCSW [phone]

## 2016-08-31 NOTE — Progress Notes (Signed)
Pt discharged to [facility] via non emergency traffic by stretcher without incident per MD order. VSS. No change in pt from AM assessment. Pain tolerable on d/c.

## 2016-08-31 NOTE — Progress Notes (Signed)
Rept called to [facility]. Rept called to [facility]. All questions answered. Non-emergency transport called.

## 2016-08-31 NOTE — Progress Notes (Addendum)
[place] at [place] NAME: Albert Fritz    MR#:  [id number]  DATE OF BIRTH:  [birth year]  SUBJECTIVE: Had a BM last night,, Stable for discharge.   CHIEF COMPLAINT:  Patient is chronically demented,Sleeping comfortably today arousable to verbal commands  REVIEW OF SYSTEMS:  Review of system unobtainable  DRUG ALLERGIES:  No Known Allergies  VITALS:  Blood pressure (!) 143/71, pulse 81, temperature 98.3 F (36.8 C), temperature source Axillary, resp. rate 18, height 6\' 2"  (1.88 m), weight 90.8 kg (200 lb 3.2 oz), SpO2 97 %.  PHYSICAL EXAMINATION:  GENERAL:  83 y.o.-year-old patient lying in the bed with no acute distress.  EYES: Pupils equal, round, reactive to light and accommodation. No scleral icterus.  HEENT: Head atraumatic, normocephalic. Oropharynx and nasopharynx clear.  NECK:  Supple, no jugular venous distention. No thyroid enlargement, no tenderness.  LUNGS: Normal breath sounds bilaterally, no wheezing, rales,rhonchi or crepitation. No use of accessory muscles of respiration.  CARDIOVASCULAR: S1, S2 normal. No murmurs, rubs, or gallops.  ABDOMEN: Soft, nontender, nondistended. Bowel sounds present. No organomegaly or mass.  EXTREMITIES: No pedal edema, cyanosis, or clubbing.  NEUROLOGIC: Chronically demented PSYCHIATRIC: The patient is alert and disoriented SKIN: No obvious rash, lesion, or ulcer.    LABORATORY PANEL:   CBC  Recent Labs Lab 08/30/16 0346  WBC 11.1*  HGB 14.2  HCT 42.5  PLT 213   ------------------------------------------------------------------------------------------------------------------  Chemistries   Recent Labs Lab 08/29/16 1429 08/30/16 0346  NA 134* 135  K 3.9 3.9  CL 102 101  CO2 25 27  GLUCOSE 127* 179*  BUN 13 17  CREATININE 0.42* 0.68  CALCIUM 8.3* 8.3*  MG 2.0  --     ------------------------------------------------------------------------------------------------------------------  Cardiac Enzymes No results for input(s): TROPONINI in the last 168 hours. ------------------------------------------------------------------------------------------------------------------  RADIOLOGY:  No results found.  EKG:   Orders placed or performed during the hospital encounter of 08/27/16  . ED EKG  . ED EKG  . EKG 12-Lead  . EKG 12-Lead  . EKG 12-Lead    ASSESSMENT AND PLAN:   This is a 83 y.o. male with a history of dementia, HTN, HLD now being admitted with:  #. AMS secondary to Escherichia coli UTI. Patient stable for discharge to skilled nursing-discharge medication include  Keflex 500  mg every 8 hours for 5 days.  #Atrial flutter with pauses Patient is asymptomatic Avoid rate limiting drugs Not a candidate for permanent pacemaker Seen by cardiology who has recommended to discontinue telemetry monitoring as patient is asymptomatic at this time  #. Dehydration -Improved.- Intake is poor because of dementia.  #. History of depression -Continue Celexa  #. History of hyperlipidemia - Continue Lipitor  #. History of Diabetes - Accuchecks achs with RISS coverage - Heart healthy, carb controlled diet - Hold glipizide to poor by mouth intake.   PT has recommended skilled nursing facility, stable for discharge to skilled nursing and arrangements are made.  All the records are reviewed and case discussed with Care Management/Social Workerr. Management plans discussed with the patient's son Albert Fritz and they are in agreement.  CODE STATUS: fc   TOTAL TIME TAKING CARE OF THIS PATIENT: 35 minutes.   POSSIBLE D/C IN 2 DAYS, DEPENDING ON CLINICAL CONDITION.  Note: This dictation was prepared with Dragon dictation along with smaller phrase technology. Any transcriptional errors that result from this process are  unintentional.   Elesia Pemberton [name] on 08/31/2016 at 9:23 AM  Between 7am to 6pm - Pager - [phone] After 6pm go to [email or web address] - password [email or web address] [facility]  [facility]  Office  [phone]  CC: Primary care physician; Albert Merles, MD

## 2016-08-31 NOTE — NC FL2 (Signed)
[place] LEVEL OF CARE SCREENING TOOL     IDENTIFICATION  Patient Name: Veer Elamin Wilmore Birthdate: 28-Apr-1928 Sex: male Admission Date (Current Location): 08/27/2016  [place] and [place] Number:  Engineering geologist and Address:  [facility] [address], [place], [place] [address]      Provider Number: [id number]  Attending Physician Name and Address:  Epifanio Lesches, MD  Relative Name and Phone Number:       Current Level of Care: Hospital Recommended Level of Care: [place] Prior Approval Number:    Date Approved/Denied:   PASRR Number:  [id number])  Discharge Plan: SNF    Current Diagnoses: Patient Active Problem List   Diagnosis Date Noted  . UTI (urinary tract infection) 08/29/2016  . Altered mental status 08/28/2016  . Chronic fatigue 07/21/2016  . Falls frequently 07/21/2016  . Neck pain, bilateral 10/23/2015  . Fall 08/25/2015  . Pain in lower back 08/25/2015  . Hyponatremia 08/25/2015  . Financial difficulties 08/25/2015  . Prostate cancer metastatic to bone ([place]) 12/31/2014  . Elevated PSA 12/05/2014  . Urinary retention 12/05/2014  . BPH (benign prostatic hypertrophy) with urinary retention 12/05/2014  . Essential (primary) hypertension 07/11/2014  . Atrial flutter ([place]) 07/11/2014  . Hypertension     Orientation RESPIRATION BLADDER Height & Weight     Self  Normal Incontinent Weight: 200 lb 3.2 oz (90.8 kg) Height:  6\' 2"  (188 cm)  BEHAVIORAL SYMPTOMS/MOOD NEUROLOGICAL BOWEL NUTRITION STATUS   (none)  (none) Incontinent Diet (Diet: Heart Healthy/ Carb Modified )  AMBULATORY STATUS COMMUNICATION OF NEEDS Skin   Extensive Assist Verbally Normal                       Personal Care Assistance Level of Assistance  Bathing, Feeding, Dressing Bathing Assistance: Limited assistance Feeding assistance: Independent Dressing Assistance: Limited assistance     Functional  Limitations Info  Sight, Hearing, Speech Sight Info: Adequate Hearing Info: Impaired Speech Info: Adequate    SPECIAL CARE FACTORS FREQUENCY  PT (By licensed PT), OT (By licensed OT)     PT Frequency:  (5) OT Frequency:  (5)            Contractures      Additional Factors Info  Code Status, Allergies, Insulin Sliding Scale Code Status Info:  (Full Code. ) Allergies Info:  (No Known Allergies. )   Insulin Sliding Scale Info:  (NovoLog Insulin Injections. )       Current Medications (08/31/2016):  This is the current hospital active medication list Current Facility-Administered Medications  Medication Dose Route Frequency Provider Last Rate Last Dose  . 0.9 %  sodium chloride infusion   Intravenous Continuous Hugelmeyer, Alexis, DO 75 mL/hr at 08/29/16 1402    . acetaminophen (TYLENOL) tablet 650 mg  650 mg Oral Q6H PRN Hugelmeyer, Alexis, DO       Or  . acetaminophen (TYLENOL) suppository 650 mg  650 mg Rectal Q6H PRN Hugelmeyer, Alexis, DO      . albuterol (PROVENTIL) (2.5 MG/3ML) 0.083% nebulizer solution 2.5 mg  2.5 mg Nebulization Q6H PRN Hugelmeyer, Alexis, DO      . aspirin chewable tablet 81 mg  81 mg Oral BID Hugelmeyer, Alexis, DO   81 mg at 08/31/16 0932  . atorvastatin (LIPITOR) tablet 10 mg  10 mg Oral Daily Hugelmeyer, Alexis, DO   10 mg at 08/30/16 1743  . bisacodyl (DULCOLAX) EC tablet 5 mg  5 mg Oral Daily PRN Hugelmeyer, Alexis, DO   5 mg at 08/29/16 1647  . cephALEXin (KEFLEX) capsule 500 mg  500 mg Oral Q8H Gouru, Aruna, MD   500 mg at 08/31/16 0559  . citalopram (CELEXA) tablet 20 mg  20 mg Oral Daily Hugelmeyer, Alexis, DO   20 mg at 08/31/16 0932  . enoxaparin (LOVENOX) injection 40 mg  40 mg Subcutaneous Q24H Hugelmeyer, Alexis, DO   40 mg at 08/31/16 0932  . insulin aspart (novoLOG) injection 0-20 Units  0-20 Units Subcutaneous TID WC Hugelmeyer, Alexis, DO   4 Units at 08/31/16 0827  . insulin aspart (novoLOG) injection 0-5 Units  0-5 Units  Subcutaneous QHS Hugelmeyer, Alexis, DO      . ipratropium (ATROVENT) nebulizer solution 0.5 mg  0.5 mg Nebulization Q6H PRN Hugelmeyer, Alexis, DO      . magnesium citrate solution 1 Bottle  1 Bottle Oral Once PRN Hugelmeyer, Alexis, DO      . ondansetron (ZOFRAN) tablet 4 mg  4 mg Oral Q6H PRN Hugelmeyer, Alexis, DO       Or  . ondansetron (ZOFRAN) injection 4 mg  4 mg Intravenous Q6H PRN Hugelmeyer, Alexis, DO      . oxyCODONE (Oxy IR/ROXICODONE) immediate release tablet 5 mg  5 mg Oral Q4H PRN Hugelmeyer, Alexis, DO   5 mg at 08/29/16 2021  . senna-docusate (Senokot-S) tablet 1 tablet  1 tablet Oral QHS PRN Hugelmeyer, Alexis, DO      . traMADol (ULTRAM) tablet 50 mg  50 mg Oral Q8H PRN Hugelmeyer, Alexis, DO   50 mg at 08/28/16 1111     Discharge Medications: Please see discharge summary for a list of discharge medications.  Relevant Imaging Results:  Relevant Lab Results:   Additional Information  (SSN: [id number])  Elaine Middleton, Veronia Beets, LCSW

## 2016-08-31 NOTE — Discharge Summary (Signed)
Sage Kopera Colligan, is a 83 y.o. male  DOB September 10, 1928  MRN [id number].  Admission date:  08/27/2016  Admitting Physician  Harvie Bridge, DO  Discharge Date:  08/31/2016   Primary MD  Marguerita Merles, MD  Recommendations for primary care physician for things to follow:   Discharged to skilled nursing. Follow-up with PCP in one week   Admission Diagnosis  Acute cystitis without hematuria [N30.00] Altered mental status, unspecified altered mental status type [R41.82]   Discharge Diagnosis  Acute cystitis without hematuria [N30.00] Altered mental status, unspecified altered mental status type [R41.82]    Active Problems:   Altered mental status   UTI (urinary tract infection)      Past Medical History:  Diagnosis Date  . Anxiety   . Arthritis   . BPH (benign prostatic hyperplasia)   . Cancer ([place])   . Cataract   . Dementia   . Diabetes mellitus without complication ([place])   . Elevated PSA   . Hearing loss   . History of kidney stones   . Hyperlipidemia   . Hypertension   . OA (osteoarthritis)   . Prostatitis   . Urinary retention   . UTI (lower urinary tract infection)     Past Surgical History:  Procedure Laterality Date  . BACK SURGERY    . BACK SURGERY    . CATARACT EXTRACTION    . HERNIA REPAIR         History of present illness and  Hospital Course:     Kindly see H&P for history of present illness and admission details, please review complete Labs, Consult reports and Test reports for all details in brief  HPI  from the history and physical done on the day of admission 83-year-old male patient with dementia, essential hypertension, hyperlipemia came in for weakness, altered mental status, found to have foul-smelling urine. Patient lives alone.   Hospital Course  #1.altered  mental status with  metabolic encephalopathy secondary to UTI. Patient urine culture showed pansensitive Escherichia coli, patient received IV Rocephin) the hospital and discharging to nursing home with by mouth Keflex for 5 days.  #2 atrial flutter with process. Patient not symptomatic. Seen by cardiology. Not a candidate for permanent pacemaker. Patient not on any AV nodal blocking agents.  3 hyperlipidemia #4. advanced dementia 5 .history of diabetes mellitus type 2 poor by mouth intake due to dementia. So hold the glipizide, continue sliding scale with coverage only.     Marland Kitchen Discharge Condition: stable   Follow UP      Discharge Instructions  and  Discharge Medications     Allergies as of 08/31/2016   No Known Allergies     Medication List    STOP taking these medications   glipiZIDE 5 MG tablet Commonly known as:  GLUCOTROL   traMADol 50 MG tablet Commonly known as:  ULTRAM   XTANDI 40 MG capsule Generic drug:  enzalutamide     TAKE these medications   acetaminophen 650 MG CR tablet Commonly known as:  TYLENOL Take 650 mg by mouth every 8 (eight) hours as needed for pain.   aspirin 81 MG chewable tablet Chew 81 mg by mouth 2 (two) times daily.   atorvastatin 10 MG tablet Commonly known as:  LIPITOR Take 10 mg by mouth daily.   cephALEXin 500 MG capsule Commonly known as:  KEFLEX Take 1 capsule (500 mg total) by mouth every 8 (eight) hours.   citalopram 20 MG tablet Commonly  known as:  CELEXA Take 1 tablet (20 mg total) by mouth daily.   insulin aspart 100 UNIT/ML injection Commonly known as:  novoLOG Inject 0-20 Units into the skin 3 (three) times daily with meals.   senna-docusate 8.6-50 MG tablet Commonly known as:  Senokot-S Take 1 tablet by mouth at bedtime as needed for mild constipation.   Vitamin D (Ergocalciferol) 50000 units Caps capsule Commonly known as:  DRISDOL Take 50,000 Units by mouth every 7 (seven) days.         Diet and Activity  recommendation: See Discharge Instructions above   Consults obtained - PT   Major procedures and Radiology Reports - PLEASE review detailed and final reports for all details, in brief -      Dg Chest Portable 1 View  Result Date: 08/27/2016 CLINICAL DATA:  co weakness as reported by family to EMS. Pt is normally ambulatory but today is unable to and also has decreased oral intake today EXAM: PORTABLE CHEST 1 VIEW COMPARISON:  09/05/2014.  Bone scan, 07-05-30. FINDINGS: Cardiac silhouette is normal in size. No mediastinal or hilar masses. No convincing adenopathy. Clear lungs.  No pleural effusion or pneumothorax. Mild expansion is noted of the posterior left eighth rib, not present on the prior radiographs with no correlative increased activity on the prior bone scan. This may reflect a focus of metastatic disease or an old healed rib fracture. IMPRESSION: No acute cardiopulmonary disease. Electronically Signed   By: Lajean Manes [name].   On: 08/27/2016 21:56    Micro Results     Recent Results (from the past 240 hour(s))  Urine culture     Status: Abnormal   Collection Time: 08/27/16  8:55 PM  Result Value Ref Range Status   Specimen Description URINE, RANDOM  Final   Special Requests NONE  Final   Culture >=100,000 COLONIES/mL ESCHERICHIA COLI (A)  Final   Report Status 08/30/2016 FINAL  Final   Organism ID, Bacteria ESCHERICHIA COLI (A)  Final      Susceptibility   Escherichia coli - MIC*    AMPICILLIN <=2 SENSITIVE Sensitive     CEFAZOLIN <=4 SENSITIVE Sensitive     CEFTRIAXONE <=1 SENSITIVE Sensitive     CIPROFLOXACIN <=0.25 SENSITIVE Sensitive     GENTAMICIN <=1 SENSITIVE Sensitive     IMIPENEM <=0.25 SENSITIVE Sensitive     NITROFURANTOIN 32 SENSITIVE Sensitive     TRIMETH/SULFA <=20 SENSITIVE Sensitive     AMPICILLIN/SULBACTAM <=2 SENSITIVE Sensitive     PIP/TAZO <=4 SENSITIVE Sensitive     Extended ESBL NEGATIVE Sensitive     * >=100,000 COLONIES/mL ESCHERICHIA  COLI       Today   Subjective:   Albert Fritz today ,Stable for discharge to skilled nursing.  Objective:   Blood pressure (!) 143/71, pulse 81, temperature 98.3 F (36.8 C), temperature source Axillary, resp. rate 18, height 6\' 2"  (1.88 m), weight 90.8 kg (200 lb 3.2 oz), SpO2 97 %.   Intake/Output Summary (Last 24 hours) at 08/31/16 1124 Last data filed at 08/30/16 1800  Gross per 24 hour  Intake              480 ml  Output                0 ml  Net              480 ml    Exam  Baseline dementia, Supple Neck,No JVD, No cervical lymphadenopathy appriciated.  Symmetrical Chest  wall movement, Good air movement bilaterally, CTAB RRR,No Gallops,Rubs or new Murmurs, No Parasternal Heave +ve B.Sounds, Abd Soft, Non tender, No organomegaly appriciated, No rebound -guarding or rigidity. No Cyanosis, Clubbing or edema, No new Rash or bruise  Data Review   CBC w Diff: Lab Results  Component Value Date   WBC 11.1 (H) 08/30/2016   HGB 14.2 08/30/2016   HCT 42.5 08/30/2016   PLT 213 08/30/2016   LYMPHOPCT 35 07/21/2016   MONOPCT 8 07/21/2016   EOSPCT 2 07/21/2016   BASOPCT 1 07/21/2016    CMP: Lab Results  Component Value Date   NA 135 08/30/2016   K 3.9 08/30/2016   CL 101 08/30/2016   CO2 27 08/30/2016   BUN 17 08/30/2016   CREATININE 0.68 08/30/2016   PROT 7.3 07/21/2016   ALBUMIN 3.6 07/21/2016   BILITOT 0.8 07/21/2016   ALKPHOS 78 07/21/2016   AST 30 07/21/2016   ALT 18 07/21/2016  .   Total Time in preparing paper work, data evaluation and todays exam - 68 minutes  Shaindy Reader [name] on 08/31/2016 at 11:24 AM    Note: This dictation was prepared with Dragon dictation along with smaller phrase technology. Any transcriptional errors that result from this process are unintentional.

## 2016-09-03 ENCOUNTER — Telehealth: Payer: Self-pay | Admitting: *Deleted

## 2016-09-03 NOTE — Telephone Encounter (Signed)
Noted-reason for pt cnl apts.

## 2016-09-03 NOTE — Telephone Encounter (Signed)
-----   Message from [place] sent at 09/02/2016  3:43 PM EDT ----- Son called and stated PT headed to [facility] for rehab-will call back to R/S-thx

## 2016-09-10 ENCOUNTER — Other Ambulatory Visit: Payer: Medicare HMO

## 2016-09-10 ENCOUNTER — Ambulatory Visit: Payer: Medicare HMO | Admitting: Internal Medicine

## 2016-09-13 ENCOUNTER — Other Ambulatory Visit: Payer: Medicare HMO

## 2016-09-13 ENCOUNTER — Ambulatory Visit: Payer: Medicare HMO | Admitting: Internal Medicine

## 2016-11-17 ENCOUNTER — Emergency Department
Admission: EM | Admit: 2016-11-17 | Discharge: 2016-11-17 | Disposition: A | Discharge status: Hospice | Payer: Medicare HMO | Attending: Emergency Medicine | Admitting: Emergency Medicine

## 2016-11-17 ENCOUNTER — Encounter: Payer: Self-pay | Admitting: Emergency Medicine

## 2016-11-17 ENCOUNTER — Emergency Department: Payer: Medicare HMO

## 2016-11-17 DIAGNOSIS — I10 Essential (primary) hypertension: Secondary | ICD-10-CM | POA: Diagnosis not present

## 2016-11-17 DIAGNOSIS — Z87891 Personal history of nicotine dependence: Secondary | ICD-10-CM | POA: Diagnosis not present

## 2016-11-17 DIAGNOSIS — R0682 Tachypnea, not elsewhere classified: Secondary | ICD-10-CM | POA: Insufficient documentation

## 2016-11-17 DIAGNOSIS — Z8546 Personal history of malignant neoplasm of prostate: Secondary | ICD-10-CM | POA: Insufficient documentation

## 2016-11-17 DIAGNOSIS — R0902 Hypoxemia: Secondary | ICD-10-CM | POA: Insufficient documentation

## 2016-11-17 DIAGNOSIS — R5383 Other fatigue: Secondary | ICD-10-CM | POA: Diagnosis not present

## 2016-11-17 DIAGNOSIS — E119 Type 2 diabetes mellitus without complications: Secondary | ICD-10-CM | POA: Insufficient documentation

## 2016-11-17 DIAGNOSIS — R4182 Altered mental status, unspecified: Secondary | ICD-10-CM | POA: Diagnosis present

## 2016-11-17 DIAGNOSIS — N390 Urinary tract infection, site not specified: Secondary | ICD-10-CM | POA: Diagnosis not present

## 2016-11-17 DIAGNOSIS — A419 Sepsis, unspecified organism: Secondary | ICD-10-CM | POA: Diagnosis not present

## 2016-11-17 DIAGNOSIS — Z515 Encounter for palliative care: Secondary | ICD-10-CM

## 2016-11-17 LAB — CBC WITH DIFFERENTIAL/PLATELET
BASOS ABS: 0 10*3/uL (ref 0–0.1)
BASOS PCT: 0 %
Eosinophils Absolute: 0 10*3/uL (ref 0–0.7)
Eosinophils Relative: 0 %
HEMATOCRIT: 48.3 % (ref 40.0–52.0)
Hemoglobin: 15.8 g/dL (ref 13.0–18.0)
Lymphocytes Relative: 19 %
Lymphs Abs: 2.4 10*3/uL (ref 1.0–3.6)
MCH: 28.9 pg (ref 26.0–34.0)
MCHC: 32.6 g/dL (ref 32.0–36.0)
MCV: 88.7 fL (ref 80.0–100.0)
MONO ABS: 0.8 10*3/uL (ref 0.2–1.0)
Monocytes Relative: 6 %
NEUTROS ABS: 9.6 10*3/uL — AB (ref 1.4–6.5)
Neutrophils Relative %: 75 %
PLATELETS: 209 10*3/uL (ref 150–440)
RBC: 5.45 MIL/uL (ref 4.40–5.90)
RDW: 15.7 % — AB (ref 11.5–14.5)
WBC: 12.8 10*3/uL — ABNORMAL HIGH (ref 3.8–10.6)

## 2016-11-17 LAB — COMPREHENSIVE METABOLIC PANEL
ALT: 18 U/L (ref 17–63)
ANION GAP: 12 (ref 5–15)
AST: 28 U/L (ref 15–41)
Albumin: 2.7 g/dL — ABNORMAL LOW (ref 3.5–5.0)
Alkaline Phosphatase: 330 U/L — ABNORMAL HIGH (ref 38–126)
BILIRUBIN TOTAL: 2.1 mg/dL — AB (ref 0.3–1.2)
BUN: 90 mg/dL — ABNORMAL HIGH (ref 6–20)
CO2: 22 mmol/L (ref 22–32)
Calcium: 10.4 mg/dL — ABNORMAL HIGH (ref 8.9–10.3)
Chloride: 117 mmol/L — ABNORMAL HIGH (ref 101–111)
Creatinine, Ser: 2.47 mg/dL — ABNORMAL HIGH (ref 0.61–1.24)
GFR calc Af Amer: 26 mL/min — ABNORMAL LOW (ref >60)
GFR, EST NON AFRICAN AMERICAN: 22 mL/min — AB (ref >60)
Glucose, Bld: 345 mg/dL — ABNORMAL HIGH (ref 65–99)
POTASSIUM: 4.4 mmol/L (ref 3.5–5.1)
Sodium: 151 mmol/L — ABNORMAL HIGH (ref 135–145)
TOTAL PROTEIN: 7.5 g/dL (ref 6.5–8.1)

## 2016-11-17 LAB — URINALYSIS, COMPLETE (UACMP) WITH MICROSCOPIC
BILIRUBIN URINE: NEGATIVE
GLUCOSE, UA: NEGATIVE mg/dL
Ketones, ur: NEGATIVE mg/dL
NITRITE: NEGATIVE
PH: 5 (ref 5.0–8.0)
Protein, ur: NEGATIVE mg/dL
SPECIFIC GRAVITY, URINE: 1.013 (ref 1.005–1.030)

## 2016-11-17 LAB — BLOOD GAS, VENOUS
Acid-base deficit: 0 mmol/L (ref 0.0–2.0)
Bicarbonate: 22.6 mmol/L (ref 20.0–28.0)
O2 SAT: 85.5 %
PCO2 VEN: 31 mmHg — AB (ref 44.0–60.0)
PH VEN: 7.47 — AB (ref 7.250–7.430)
PO2 VEN: 47 mmHg — AB (ref 32.0–45.0)
Patient temperature: 37

## 2016-11-17 LAB — PROCALCITONIN: PROCALCITONIN: 0.72 ng/mL

## 2016-11-17 LAB — TROPONIN I: Troponin I: 0.43 ng/mL (ref <0.03)

## 2016-11-17 LAB — LACTIC ACID, PLASMA: Lactic Acid, Venous: 3 mmol/L (ref 0.5–1.9)

## 2016-11-17 MED ORDER — SODIUM CHLORIDE 0.9 % IV BOLUS (SEPSIS)
1000.0000 mL | Freq: Once | INTRAVENOUS | Status: AC
  Administered 2016-11-17: 1000 mL via INTRAVENOUS

## 2016-11-17 MED ORDER — VANCOMYCIN HCL IN DEXTROSE 1-5 GM/200ML-% IV SOLN: 1000.0000 mg | Freq: Once | INTRAVENOUS | Status: DC

## 2016-11-17 MED ORDER — PIPERACILLIN-TAZOBACTAM 3.375 G IVPB 30 MIN
3.3750 g | Freq: Once | INTRAVENOUS | Status: AC
  Administered 2016-11-17: 3.375 g via INTRAVENOUS

## 2016-11-17 MED ORDER — PIPERACILLIN-TAZOBACTAM 3.375 G IVPB 30 MIN
INTRAVENOUS | Status: AC
  Administered 2016-11-17: 3.375 g via INTRAVENOUS
  Filled 2016-11-17: qty 50

## 2016-11-17 MED ORDER — SODIUM CHLORIDE 0.9 % IV BOLUS (SEPSIS): 1000.0000 mL | Freq: Once | INTRAVENOUS | Status: DC

## 2016-11-17 NOTE — ED Triage Notes (Signed)
Patient presents to the ED via EMS from [facility] healthcare.  Patient is unresponsive at this time and tachypneic.  Per EMS, patient's oxygen saturation was 85% on 2L [place] at the nursing home.  Patient's blood pressure with EMS was 82/50, patient's heart rate was in the 140s-160s.  Patient appears pale and diaphoretic.

## 2016-11-17 NOTE — ED Notes (Signed)
Patient's son in room and discussing patient's condition with Dr. Jimmye Norman.  Paitent's son states, he would like to "keep patient comfortable".

## 2016-11-17 NOTE — ED Notes (Signed)
Pt transferred to Hospice home via [facility]. Per Transfer of medical necessity filled out by Vicente Males, RN, pt " is determined to be in the process of dying from infection at this time". Pt's family aware of discharge to hospice home and consents to transfer via EMS.

## 2016-11-17 NOTE — ED Provider Notes (Addendum)
[facility] Emergency Department Provider Note       Time seen: ----------------------------------------- 11:13 AM on 11/26/2016 ----------------------------------------- Level V caveat: History/ROS limited by altered mental status    I have reviewed the triage vital signs and the nursing notes.   HISTORY   Chief Complaint Altered Mental Status    HPI Albert Fritz is a 83 y.o. male who presents to the ED for altered mental status and hypoxia. Patient arrives via EMS from [place] with decreased oxygen saturation. He is also typically ambulatory but has been very lethargic. No further information is available.   Past Medical History:  Diagnosis Date  . Anxiety   . Arthritis   . BPH (benign prostatic hyperplasia)   . Cancer ([place])   . Cataract   . Dementia   . Diabetes mellitus without complication ([place])   . Elevated PSA   . Hearing loss   . History of kidney stones   . Hyperlipidemia   . Hypertension   . OA (osteoarthritis)   . Prostatitis   . Urinary retention   . UTI (lower urinary tract infection)     Patient Active Problem List   Diagnosis Date Noted  . UTI (urinary tract infection) 08/29/2016  . Altered mental status 08/28/2016  . Chronic fatigue 07/21/2016  . Falls frequently 07/21/2016  . Neck pain, bilateral 10/23/2015  . Fall 08/25/2015  . Pain in lower back 08/25/2015  . Hyponatremia 08/25/2015  . Financial difficulties 08/25/2015  . Prostate cancer metastatic to bone ([place]) 12/31/2014  . Elevated PSA 12/05/2014  . Urinary retention 12/05/2014  . BPH (benign prostatic hypertrophy) with urinary retention 12/05/2014  . Essential (primary) hypertension 07/11/2014  . Atrial flutter ([place]) 07/11/2014  . Hypertension     Past Surgical History:  Procedure Laterality Date  . BACK SURGERY    . BACK SURGERY    . CATARACT EXTRACTION    . HERNIA REPAIR      Allergies Patient has no known allergies.  Social  History Social History  Substance Use Topics  . Smoking status: Former Smoker    Types: Cigarettes  . Smokeless tobacco: Never Used     Comment: quit 30 years ago  . Alcohol use No    Review of Systems Unknown, positive for altered mental status, dyspnea  All systems negative/normal/unremarkable except as stated in the HPI  ____________________________________________   PHYSICAL EXAM:  VITAL SIGNS: ED Triage Vitals  Enc Vitals Group     BP      Pulse      Resp      Temp      Temp src      SpO2      Weight      Height      Head Circumference      Peak Flow      Pain Score      Pain Loc      Pain Edu?      Excl. in [place]?     Constitutional: Lethargic, poorly responsive, moderate distress  Eyes: Conjunctivae are pale. Normal extraocular movements. ENT   Head: Normocephalic and atraumatic.   Nose: No congestion/rhinnorhea.   Mouth/Throat: Mucous membranes are moist.   Neck: No stridor. Cardiovascular: Rapid rate, regular rhythm. No murmurs, rubs, or gallops. Respiratory: Tachypnea with grossly clear breath sounds Gastrointestinal: Soft and nontender. Hypoactive bowel sounds Musculoskeletal: Unremarkable lower extremities, no edema Neurologic:  Lethargic, responds to painful stimuli Skin:  Skin is warm, dry  and intact. Pallor is noted Psychiatric: Poorly responsive ____________________________________________  EKG: Interpreted by me. Likely sinus tachycardia with a rate of 122 bpm, long QT, possible inferior infarct age indeterminate.  ____________________________________________  ED COURSE:  Pertinent labs & imaging results that were available during my care of the patient were reviewed by me and considered in my medical decision making (see chart for details). Patient presents for altered mental status and hypoxia likely septic, we will assess with labs and imaging as indicated. We will initiate sepsis protocols    Procedures ____________________________________________   LABS (pertinent positives/negatives)  Labs Reviewed  LACTIC ACID, PLASMA - Abnormal; Notable for the following:       Result Value   Lactic Acid, Venous 3.0 (*)    All other components within normal limits  COMPREHENSIVE METABOLIC PANEL - Abnormal; Notable for the following:    Sodium 151 (*)    Chloride 117 (*)    Glucose, Bld 345 (*)    BUN 90 (*)    Creatinine, Ser 2.47 (*)    Calcium 10.4 (*)    Albumin 2.7 (*)    Alkaline Phosphatase 330 (*)    Total Bilirubin 2.1 (*)    GFR calc non Af Amer 22 (*)    GFR calc Af Amer 26 (*)    All other components within normal limits  TROPONIN I - Abnormal; Notable for the following:    Troponin I 0.43 (*)    All other components within normal limits  CBC WITH DIFFERENTIAL/PLATELET - Abnormal; Notable for the following:    WBC 12.8 (*)    RDW 15.7 (*)    Neutro Abs 9.6 (*)    All other components within normal limits  BLOOD GAS, VENOUS - Abnormal; Notable for the following:    pH, Ven 7.47 (*)    pCO2, Ven 31 (*)    pO2, Ven 47.0 (*)    All other components within normal limits  URINALYSIS, COMPLETE (UACMP) WITH MICROSCOPIC - Abnormal; Notable for the following:    Color, Urine AMBER (*)    APPearance CLOUDY (*)    Hgb urine dipstick SMALL (*)    Leukocytes, UA LARGE (*)    Bacteria, UA MANY (*)    Squamous Epithelial / LPF 0-5 (*)    All other components within normal limits  CULTURE, BLOOD (ROUTINE X 2)  CULTURE, BLOOD (ROUTINE X 2)  URINE CULTURE  LACTIC ACID, PLASMA  PROCALCITONIN   CRITICAL CARE Performed by: Earleen Newport   Total critical care time: 30 minutes  Critical care time was exclusive of separately billable procedures and treating other patients.  Critical care was necessary to treat or prevent imminent or life-threatening deterioration.  Critical care was time spent personally by me on the following activities: development of treatment  plan with patient and/or surrogate as well as nursing, discussions with consultants, evaluation of patient's response to treatment, examination of patient, obtaining history from patient or surrogate, ordering and performing treatments and interventions, ordering and review of laboratory studies, ordering and review of radiographic studies, pulse oximetry and re-evaluation of patient's condition.  RADIOLOGY Images were viewed by me  Chest x-ray IMPRESSION: Aortic atherosclerosis. No acute cardiopulmonary abnormality seen. ____________________________________________  FINAL ASSESSMENT AND PLAN  Sepsis  Plan: Patient's labs and imaging were dictated above. Patient had presented for altered mental status with tachypnea and hypoxia and indications of sepsis. He does appear to be septic likely from a UTI. He has signs of severe sepsis  with end organ dysfunction. Family is requesting to make him comfort cares only. We will attempt placement in hospice home.   Earleen Newport, MD   Note: This note was generated in part or whole with voice recognition software. Voice recognition is usually quite accurate but there are transcription errors that can and very often do occur. I apologize for any typographical errors that were not detected and corrected.     Earleen Newport, MD 11/07/2016 1221    Earleen Newport, MD 11/30/2016 [phone]

## 2016-11-17 NOTE — Progress Notes (Signed)
New hospice home referral received from ED MD Dr. Jimmye Norman. Mr. Warrell came to the [facility] ED from [facility] for evaluation of decreased oxygen saturations and unresponsiveness. He has  PMH of metastatic prostate cancer, BPH, HTN, HLD, Dementia and osteoarthritis. In the Ed he was found to have an elevated lactic acid and WBC. He remains unresponsive with increased heart rate and respiratory rate. ED attending physician Dr. Jimmye Norman spoke with patient's son Nicole Kindred in the room, Nicole Kindred has indicated that he wants to focus on his father's comfort with transfer to the hospice home. Writer met in the room with Nicole Kindred and patient's sister Mel Almond to initiate education regarding hospice services, philosophy and team approach to care with good understanding voice. Questions answered, consents signed. Patient remains unresponsive, respiratory rate 30-40's. Patient information faxed to referral, report called to the hospice home, EMS notified for transport. Signed DNR in place. Hospital care team all aware and in agreement with discharge plan. Thank you. Flo Shanks RN, BSN, [facility], hospital Liaison [phone] c

## 2016-11-19 LAB — URINE CULTURE

## 2016-11-19 LAB — BLOOD CULTURE ID PANEL (REFLEXED)
ACINETOBACTER BAUMANNII: NOT DETECTED
CANDIDA ALBICANS: NOT DETECTED
CANDIDA PARAPSILOSIS: NOT DETECTED
CANDIDA TROPICALIS: NOT DETECTED
Candida glabrata: NOT DETECTED
Candida krusei: NOT DETECTED
ENTEROBACTERIACEAE SPECIES: NOT DETECTED
Enterobacter cloacae complex: NOT DETECTED
Enterococcus species: NOT DETECTED
Escherichia coli: NOT DETECTED
HAEMOPHILUS INFLUENZAE: NOT DETECTED
KLEBSIELLA PNEUMONIAE: NOT DETECTED
Klebsiella oxytoca: NOT DETECTED
LISTERIA MONOCYTOGENES: NOT DETECTED
Neisseria meningitidis: NOT DETECTED
PROTEUS SPECIES: NOT DETECTED
Pseudomonas aeruginosa: NOT DETECTED
STREPTOCOCCUS PYOGENES: NOT DETECTED
Serratia marcescens: NOT DETECTED
Staphylococcus aureus (BCID): NOT DETECTED
Staphylococcus species: NOT DETECTED
Streptococcus agalactiae: NOT DETECTED
Streptococcus pneumoniae: NOT DETECTED
Streptococcus species: NOT DETECTED

## 2016-11-19 NOTE — Progress Notes (Signed)
PHARMACY - PHYSICIAN COMMUNICATION CRITICAL VALUE ALERT - BLOOD CULTURE IDENTIFICATION (BCID)  Results for orders placed or performed during the hospital encounter of 11/06/2016  Blood Culture ID Panel (Reflexed) (Collected: 11/25/2016 11:18 AM)  Result Value Ref Range   Enterococcus species NOT DETECTED NOT DETECTED   Listeria monocytogenes NOT DETECTED NOT DETECTED   Staphylococcus species NOT DETECTED NOT DETECTED   Staphylococcus aureus NOT DETECTED NOT DETECTED   Streptococcus species NOT DETECTED NOT DETECTED   Streptococcus agalactiae NOT DETECTED NOT DETECTED   Streptococcus pneumoniae NOT DETECTED NOT DETECTED   Streptococcus pyogenes NOT DETECTED NOT DETECTED   Acinetobacter baumannii NOT DETECTED NOT DETECTED   Enterobacteriaceae species NOT DETECTED NOT DETECTED   Enterobacter cloacae complex NOT DETECTED NOT DETECTED   Escherichia coli NOT DETECTED NOT DETECTED   Klebsiella oxytoca NOT DETECTED NOT DETECTED   Klebsiella pneumoniae NOT DETECTED NOT DETECTED   Proteus species NOT DETECTED NOT DETECTED   Serratia marcescens NOT DETECTED NOT DETECTED   Haemophilus influenzae NOT DETECTED NOT DETECTED   Neisseria meningitidis NOT DETECTED NOT DETECTED   Pseudomonas aeruginosa NOT DETECTED NOT DETECTED   Candida albicans NOT DETECTED NOT DETECTED   Candida glabrata NOT DETECTED NOT DETECTED   Candida krusei NOT DETECTED NOT DETECTED   Candida parapsilosis NOT DETECTED NOT DETECTED   Candida tropicalis NOT DETECTED NOT DETECTED    Name of physician (or Provider) Contacted:   Patient with 1 of 4 bottles with Gram Positive rods, no BCID at this time. Patient transferred from ER to [facility] per family request to make him comfort care.  See MD ER note and RN ER note.  Changes to prescribed antibiotics required:    Bunnie Lederman A 11/19/2016  1:47 PM

## 2016-11-20 NOTE — Progress Notes (Signed)
Urine culture: 100 k Klebsiella   Per Notes, patient being transitioned to comfort care measures only @ hospice home.  Larene Beach, PharmD

## 2016-11-21 LAB — CULTURE, BLOOD (ROUTINE X 2): Special Requests: ADEQUATE

## 2016-11-22 LAB — CULTURE, BLOOD (ROUTINE X 2)
CULTURE: NO GROWTH
Special Requests: ADEQUATE

## 2016-12-06 DEATH — deceased

## 2018-02-13 IMAGING — CR DG CERVICAL SPINE 2 OR 3 VIEWS
1 series · 4 of 4 positions shown · non-contrast
Comparison: Nuclear bone scan of December 23, 2014

CLINICAL DATA: 2-3 weeks of posterior neck pain; prostate
malignancy metastatic to the skeleton.

EXAM:
CERVICAL SPINE - 2-3 VIEW

[Series 1: dg cervical spine 2 or 3 views · 0.14mm/px · 4 of 4 slices shown]
[im 1/4]
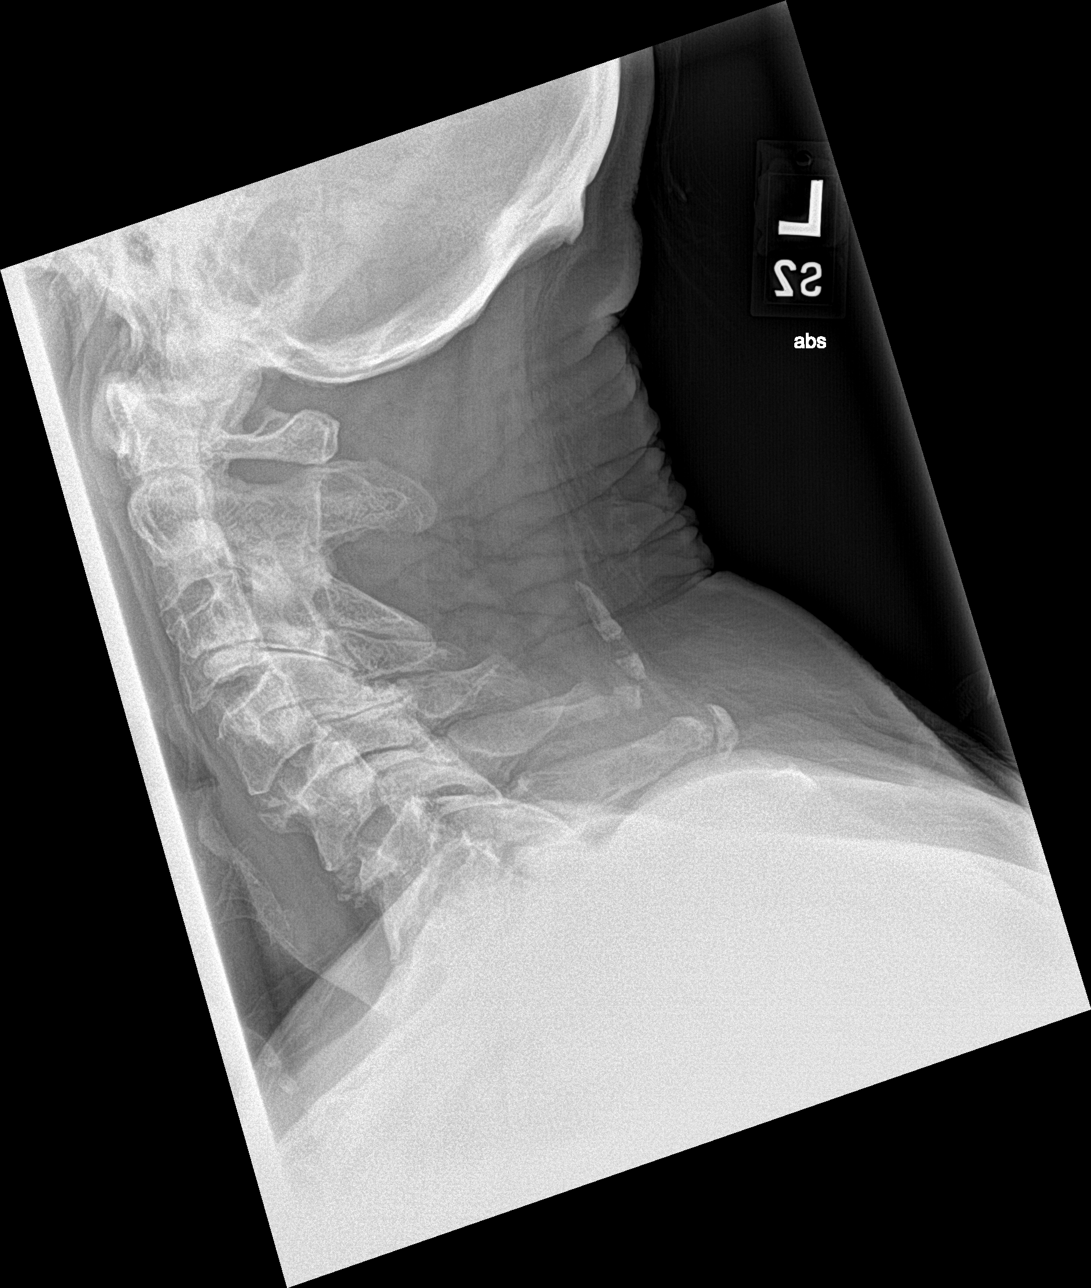
[im 2/4]
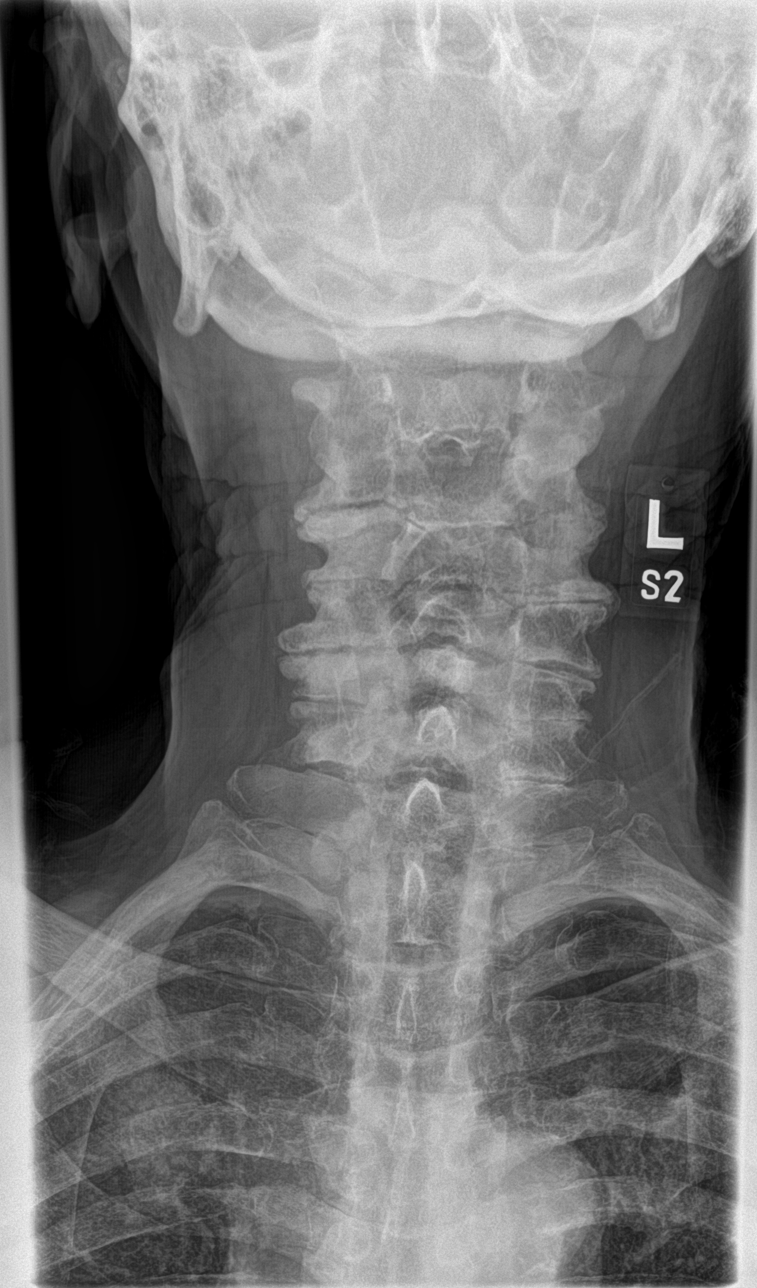
[im 3/4]
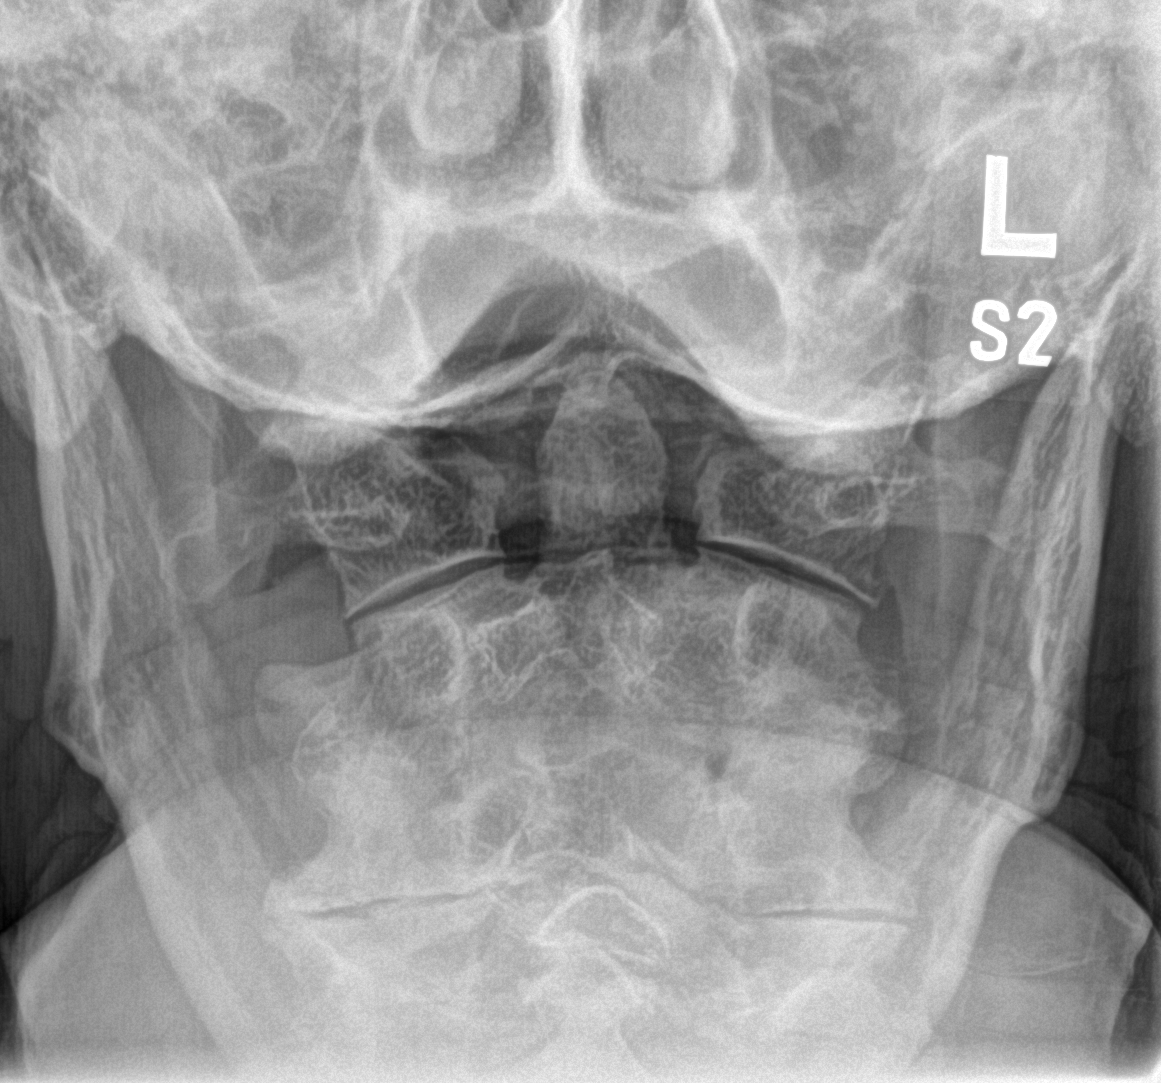
[im 4/4]
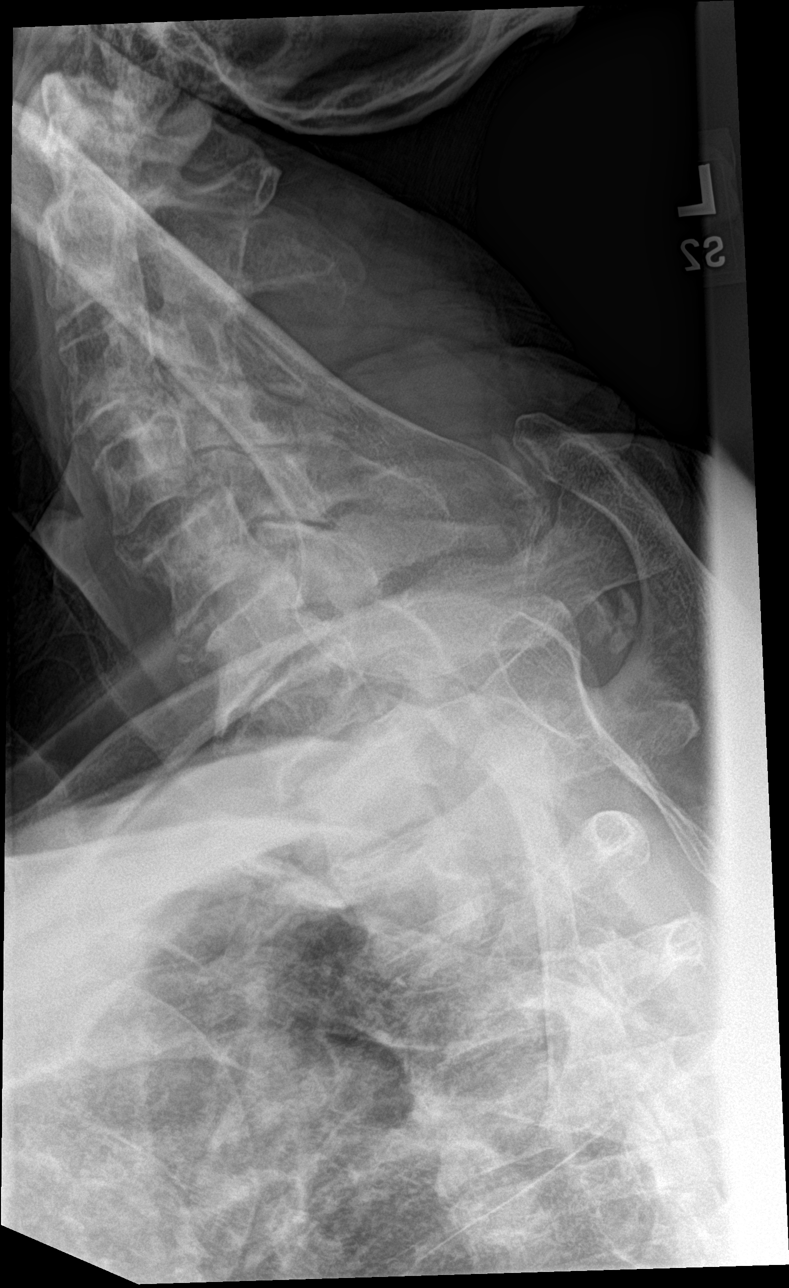

[4 of 4 positions shown; findings below may reference images not displayed]

FINDINGS: The bones are subjectively osteopenic. The vertebral bodies are
preserved in height. There is disc space narrowing at C6-7. There
are large anterior endplate osteophytes from C3 inferiorly. There is
multilevel facet joint hypertrophy bilaterally. The odontoid is
intact. The prevertebral soft tissue spaces are normal.
IMPRESSION: Endplate spurring and facet joint hypertrophy at multiple levels
bilaterally. Disc space narrowing at C6-7. These findings are
consistent with osteoarthritis. There are no findings worrisome for
metastatic disease.
